# Patient Record
Sex: Male | Born: 1960 | Race: White | Hispanic: No | Smoking: Current every day smoker
Health system: Southern US, Community
[De-identification: ages and names within clinical notes are randomized; demographics above are authoritative.]

## PROBLEM LIST (undated history)

## (undated) DIAGNOSIS — F329 Major depressive disorder, single episode, unspecified: Secondary | ICD-10-CM

## (undated) DIAGNOSIS — F32A Depression, unspecified: Secondary | ICD-10-CM

## (undated) HISTORY — PX: OTHER SURGICAL HISTORY: SHX169

---

## 2002-03-29 ENCOUNTER — Encounter: Payer: Self-pay | Admitting: Orthopedic Surgery

## 2002-04-01 ENCOUNTER — Encounter: Payer: Self-pay | Admitting: Orthopedic Surgery

## 2002-04-01 ENCOUNTER — Inpatient Hospital Stay (HOSPITAL_COMMUNITY): Admission: RE | Admit: 2002-04-01 | Discharge: 2002-04-03 | Payer: Self-pay | Admitting: Orthopedic Surgery

## 2002-06-08 ENCOUNTER — Observation Stay (HOSPITAL_COMMUNITY): Admission: EM | Admit: 2002-06-08 | Discharge: 2002-06-08 | Payer: Self-pay | Admitting: Emergency Medicine

## 2002-06-08 ENCOUNTER — Encounter: Payer: Self-pay | Admitting: Emergency Medicine

## 2002-06-08 ENCOUNTER — Encounter: Payer: Self-pay | Admitting: Orthopedic Surgery

## 2002-07-17 ENCOUNTER — Encounter: Payer: Self-pay | Admitting: Emergency Medicine

## 2002-07-17 ENCOUNTER — Encounter: Payer: Self-pay | Admitting: Specialist

## 2002-07-17 ENCOUNTER — Inpatient Hospital Stay (HOSPITAL_COMMUNITY): Admission: EM | Admit: 2002-07-17 | Discharge: 2002-07-17 | Payer: Self-pay | Admitting: Emergency Medicine

## 2002-08-30 ENCOUNTER — Encounter: Payer: Self-pay | Admitting: Specialist

## 2002-08-30 ENCOUNTER — Encounter: Payer: Self-pay | Admitting: Orthopedic Surgery

## 2002-08-30 ENCOUNTER — Emergency Department (HOSPITAL_COMMUNITY): Admission: EM | Admit: 2002-08-30 | Discharge: 2002-08-30 | Payer: Self-pay | Admitting: *Deleted

## 2002-08-30 ENCOUNTER — Encounter: Payer: Self-pay | Admitting: *Deleted

## 2010-06-29 ENCOUNTER — Emergency Department (HOSPITAL_COMMUNITY): Admission: EM | Admit: 2010-06-29 | Discharge: 2010-06-29 | Payer: Self-pay | Admitting: Emergency Medicine

## 2011-03-17 LAB — POCT I-STAT, CHEM 8
BUN: 9 mg/dL (ref 6–23)
Creatinine, Ser: 1 mg/dL (ref 0.4–1.5)
Glucose, Bld: 83 mg/dL (ref 70–99)
Potassium: 4.3 mEq/L (ref 3.5–5.1)
Sodium: 141 mEq/L (ref 135–145)

## 2011-03-17 LAB — DIFFERENTIAL
Lymphocytes Relative: 13 % (ref 12–46)
Lymphs Abs: 1.6 10*3/uL (ref 0.7–4.0)
Monocytes Relative: 5 % (ref 3–12)
Neutrophils Relative %: 82 % — ABNORMAL HIGH (ref 43–77)

## 2011-03-17 LAB — CBC
Hemoglobin: 14.3 g/dL (ref 13.0–17.0)
MCV: 100.7 fL — ABNORMAL HIGH (ref 78.0–100.0)
Platelets: 235 10*3/uL (ref 150–400)
RBC: 4.01 MIL/uL — ABNORMAL LOW (ref 4.22–5.81)
WBC: 12.2 10*3/uL — ABNORMAL HIGH (ref 4.0–10.5)

## 2011-03-17 LAB — RAPID URINE DRUG SCREEN, HOSP PERFORMED
Amphetamines: NOT DETECTED
Barbiturates: NOT DETECTED
Benzodiazepines: NOT DETECTED
Opiates: NOT DETECTED

## 2011-03-17 LAB — URINALYSIS, ROUTINE W REFLEX MICROSCOPIC
Bilirubin Urine: NEGATIVE
Glucose, UA: NEGATIVE mg/dL
Hgb urine dipstick: NEGATIVE
Ketones, ur: NEGATIVE mg/dL
Leukocytes, UA: NEGATIVE
pH: 5 (ref 5.0–8.0)

## 2011-03-17 LAB — POCT CARDIAC MARKERS
CKMB, poc: 1.8 ng/mL (ref 1.0–8.0)
Myoglobin, poc: 206 ng/mL (ref 12–200)
Troponin i, poc: <0.05 ng/mL (ref 0.00–0.09)

## 2011-05-17 NOTE — H&P (Signed)
[facility]  Patient:    Larry Salinas, Larry Salinas Visit Number: [id number] MRN: [id number]          Service Type: Attending:  [name], M.D. Dictated by:   Sammuel Cooper. Mahar, P.A. Adm. Date:  04/01/02                           History and Physical  DATE OF BIRTH:  May 14, 1961.  CHIEF COMPLAINT:  Right hip pain.  HISTORY OF PRESENT ILLNESS:  The patient is a 50 year old male with a 3 month history of right hip and leg pain near constant in nature.  He has a history of having a total hip replacement in 1995 on the right hip and had been doing well until approximately 3 months ago.  This initial hip replacement was done by Dr. Fraser Din.  Over the last 3 months it has been significantly worse and it has gotten to the point that he is having difficulty ambulating.  It is significantly interfering with his activities of daily living, his work and his quality of life.  Risks and benefits of the proposed procedure were discussed with the patient by Dr. [name] and he indicated understanding and wished to proceed.  ALLERGIES:  No known drug allergies.  MEDICATIONS:  Darvocet-N-100, 2 tablets p.o. b.i.d. to t.i.d. p.r.n. pain.  PAST MEDICAL HISTORY:  Healthy.  PAST SURGICAL HISTORY: 1. Left total hip arthroplasty in 1993. 2. Left total hip acetabular revision in 1998. 3. Right total hip arthroplasty in 1995.  SOCIAL HISTORY:  The patient smokes approximately 1 pack of cigarettes per day.  He drinks alcohol on a rare basis.  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  FAMILY HISTORY:  Father alive at age 4 with a history of a stroke, otherwise healthy. Mother deceased at age 48 of unknown cause.  REVIEW OF SYSTEMS:  The patient denies any fevers, chills, sweats or bleeding tendencies.  CNS:  Denies any blurred vision, double vision, seizures, headache, paralysis.   Cardiovascular:  Denies any chest pain, angina, orthopnea, claudication, palpitations.  Pulmonary:  Denies any shortness of breath, productive cough or hemoptysis.  GI:  Denies nausea, vomiting, constipation, diarrhea, melena or blood stool.  GU:  Denies dysuria, hematuria, discharge.  Musculoskeletal:   Complains of right lower extremity pain but denies any paresthesia, numbness or paralysis.  PHYSICAL EXAMINATION:  VITAL SIGNS:  Blood pressure 104/72, respirations 16, and unlabored.  Pulse is 56 and regular.  GENERAL:  The patient is a 50 year old white male who is alert and oriented in no acute distress.  Well-nourished well-groomed.  Appears his stated age. Very pleasant and cooperative to examination.  Walks with an antalgic gait.  HEENT:  Head is normocephalic and atraumatic.  Pupils, equal, round, reactive to light.  Extraocular movements intact.  Nose:  Patent bilaterally.  Pharynx is clear without any erythema or exudate.  NECK:  Soft and supple.  No bruits appreciated.  No lymphadenopathy or thyromegaly note.  CHEST:  There is wheezes throughout bilateral lung fields.  BREASTS:  Not pertinent and not performed.  HEART:  S1, S2 regular rate and rhythm, no murmurs, rubs or gallops noted.  ABDOMEN:  Soft to palpation.  Nontender nondistended.  No organomegaly noted. Positive bowel sounds throughout.  GU:  Not pertinent and not performed.  EXTREMITIES:  Right hip pain. Positive  posterior tibial pulse equal bilaterally.  Sensation is grossly intact to light touch throughout bilateral lower extremities and distal motor function is intact 5/5.  SKIN:  Intact without any lesions or rashes.  X-RAYS:  Reveal acetabular component being in near vertical position without any screws for fixation.  IMPRESSION:  Loose acetabular component, right total hip.  PLAN:  Admit to [facility] on April 01, 2002, for a right total hip acetabular revision which will be done by  Dr. [name]. Dictated by:   Sammuel Cooper. Mahar, P.A. Attending:  [name], M.D. DD:  03/29/02 TD:  03/29/02 Job: [id number]

## 2011-05-17 NOTE — Op Note (Signed)
[facility]  Patient:    Larry Salinas, Larry Salinas Visit Number: [id number] MRN: [id number]          Service Type: SUR Location: 1800 1826 01 Attending Physician:  Erasmo Leventhal Dictated by:   R. Valma Cava, M.D. Proc. Date: 07/17/02 Admit Date:  07/17/2002 Discharge Date: 07/17/2002                             Operative Report  PREOPERATIVE DIAGNOSIS:  Dislocated right total hip arthroplasty.  POSTOPERATIVE DIAGNOSIS:  Dislocated right total hip arthroplasty.  OPERATION:  Closed reduction of dislocated right total hip arthroplasty with examination under anesthesia utilizing fluoroscopy.  SURGEON:  R. Valma Cava, M.D.  ANESTHESIA:  General.  ANESTHESIOLOGIST:  Janetta Hora. Gelene Mink, M.D.  ESTIMATED BLOOD LOSS:  None.  COMPLICATIONS:  None.  DISPOSITION:  To PACU stable.  DESCRIPTION OF PROCEDURE:  The patient had been counseled in the holding area in the emergency room.  He was taken to the OR and placed in the supine position under general anesthesia.  Following this, he underwent general closed reduction with 90 degree flexion of the hip and the knee with general longitudinal traction, slight internal rotation.  The hip had a palpable and notable reduction.  His leg lengths were symmetric after this.  C-arm confirmed anatomic reduction and dislocation.  No complications.  General exacerbation under anesthesia revealed flexion to 90, external rotation to 30 degrees and internal rotation was to neutral.  Abduction was to 40 degrees. He was stable throughout this range of motion as noted above, confirmed clinically and radiographically with fluoroscopy.  _____ was applied. Pulses remained stable.  He was awakened, intubated and taken from the operating room to PACU in stable condition.  PLAN:  Plan will be stabilization to PACU and discharged home when stable. Dictated by:   R. Valma Cava, M.D. Attending Physician:   Erasmo Leventhal DD:  07/17/02 TD:  07/21/02 Job: [id number]

## 2011-05-17 NOTE — Op Note (Signed)
NAME:  Larry Salinas, Larry Salinas                          ACCOUNT NO.:  [id number]   MEDICAL RECORD NO.:  [id number]                   PATIENT TYPE:  EMS   LOCATION:  MINO                                 FACILITY:  [facility]   PHYSICIAN:  Robert A. Thomasena Edis, M.D.             DATE OF BIRTH:  May 24, 1961   DATE OF PROCEDURE:  08/30/2002  DATE OF DISCHARGE:                                 OPERATIVE REPORT   PREOPERATIVE DIAGNOSES:  Right total hip arthroplasty recurrent dislocation.   POSTOPERATIVE DIAGNOSES:  Right total hip arthroplasty recurrent  dislocation.   OPERATION PERFORMED:  Closed reduction and dislocation of right total hip  arthroplasty.  Examination under anesthesia with fluoroscopy.   SURGEON:  Elana Alm. Thomasena Edis, M.D.   ANESTHESIA:  General.   ESTIMATED BLOOD LOSS:  None.   COMPLICATIONS:  None.   DISPOSITION:  To PACU stable.   INDICATIONS FOR PROCEDURE:  The patient is a 50 year old male status post  right total hip arthroplasty with revision.  Last surgery by Dr. Simonne Come.  He now has recurrent dislocation of the right hip.  I last, approximately a  month ago, saw the patient after dislocation.  I did a closed reduction.  He  did not follow up in the office.  Today the patient was lying in bed, rolled  over and the hip popped out and the hip dislocated spontaneously in the  emergency room.  He was found to have a recurrent dislocation of the right  total hip arthroplasty.  He desires closed reduction and he understood the  risks and benefits and wished to proceed.   DESCRIPTION OF PROCEDURE:  The patient was counseled in the emergency  department in the holding area and taken to the operating room and placed in  supine position with general anesthesia.  Following this, the right lower  extremity was examined.  He was shortened and slightly rotated.  It was very  difficult reduction.  Last month it went in much easier.  This month it  required quite a bit more  longitudinal traction and gentle internal rotation  for a palpable reduction.  C-arm utilized.  Examination under anesthesia  revealed he had tight adductors.  He could flex just 80 degrees.  External  rotate 20 and internal rotate 20 degrees.  This was less range of motion  than he has on the left side.  C-arm was utilized to confirm anatomic  reduction.  There were no complications.  He was placed in knee immobilizer.  Circulation remained intact.    PLAN:  He will be awakened, stabilized in the PACU, discharged to home.  He  will be utilizing crutches, weightbearing as tolerated and knee immobilizer  at all times.  Vicodin and Robaxin was prescribed.  He will use ice on the  hip.  He is to follow up in the office in seven to 10 days.  All questions  encouraged  and answered for patient in detail.                                               Robert A. Thomasena Edis, M.D.    RAC/MEDQ  D:  08/30/2002  T:  08/31/2002  Job:  [id number]

## 2011-05-17 NOTE — Op Note (Signed)
[facility]  Patient:    Larry Salinas, Larry Salinas Visit Number: [id number] MRN: [id number]          Service Type: SUR Location: 4W 0482 01 Attending Physician:  [name] Page Dictated by:   [name], M.D. Proc. Date: 04/01/02 Admit Date:  04/01/2002 Discharge Date: 04/03/2002                             Operative Report  PREOPERATIVE DIAGNOSES:  Loose malpositioned acetabular component, right total hip replacement.  POSTOPERATIVE DIAGNOSES:  Loose malpositioned acetabular component, right total hip replacement.  OPERATION PERFORMED:  Revision of acetabular components and femoral head and neck right total hip replacement.  SURGEON:  [name], M.D.  ASSISTANT:  [name], M.D.  ANESTHESIA:  General.  PATHOLOGY AND JUSTIFICATION FOR PROCEDURE:  The total hip was originally placed by Dr. Wilmer Salinas in 1996 using the dual geometry HA modality for the acetabulum. Sometime around the first of the year, he developed pain and inability to use the right leg. When I saw him on March 04, 2002, the femoral component was in good condition but he had vertical orientation to the acetabulum and it was felt revision was indicated. From a technical standpoint, he had a 56 mm cup, a plus zero 26 mm femoral head.  DESCRIPTION OF PROCEDURE:  Prophylactic antibiotics, satisfactory general anesthesia, Foley catheter inserted, left lateral decubitus position using the [name] frame, the right hip was prepped with duraprep, draped in a sterile field. I went through the old surgical incision through the fascia lata, external rotators were opened adjacent to the femur and the capsule which was quite thickened was opened. A small amount of clear looking fluid came forward, there was no evidence for any infection. I then performed a subtotal capsulectomy dislocating the hip and removing the femoral head. I then tried to expose the acetabular  component which from my position as a surgeon was basically sitting in the position like a bowl on a table. His perimeter was overgrown with soft tissue and we spent a good bit of time removing most of it. We were unable to budge either the cup or the plastic liner using standard technique of cortical screw through the polyethylene into the metal acetabulum. Consequently I had to take out the polyethylene piecemeal with osteotome and rongeur and when most of it had been removed, we were able to get enough leverage that we were able to remove the remainder of the polyethylene in the acetabulum. I then inspected the acetabulum, cleaned it of a good bit of fibrous tissue and began the acetabular reaming process eventually ending up at a 68 mm cup which gave what we felt was good bony contact around the perimeter. We also went as deep as we could checking this with a drill bit. There was still some of the ridging from the dual geometry remaining superiorly. After going through a trial reduction and finding it stable and in doing this checking the position of the cup, I went ahead and placed bony ______ material from the acetabular reamings and the acetabulum. I then impacted tightly the final 68 microstructured cup. I then stabilized it further with two screws, one vertically into the ileum and the other slightly posterior to this. I then went through another trial reduction and found once again the hip to be stable and placed the 10 degree polyethylene liner with a scribe  line at 12 oclock. I then went through one more trial reduction and found that we could dislocate the hip using a plus 5 neck length with the leg in extreme internal rotation and flexion and when we went to a plus 10 neck length, the hip was not dislocatable. Also leg lengths appeared to be symmetrical. Consequently, we went with our final neck length of plus 10 C tapered head and we also converted from a 26 to a 32 mm head  for better stability. I also used the cross fire polyethylene liner for more longevity. After checking the hip for final stability, we irrigated it well with some sterile saline and then closed the external rotators against the femur and the fascia lata with interrupted #1 Vicryl in layers. The subcutaneous tissue was closed with a combination of #0 and 2-0 Vicryl, skin with staples. Betadine and Adaptic dry sterile dressing were applied. He was gently placed on his back and in an abduction pillow and taken to the recovery room in satisfactory condition with no known complications. Estimated blood loss was perhaps 300 cc with no blood replacement. Dictated by:   [name], M.D. Attending Physician:  [name] DD:  04/01/02 TD:  04/02/02 Job: [id number]

## 2011-05-17 NOTE — Op Note (Signed)
[facility]  Patient:    Larry Salinas, Larry Salinas Visit Number: [id number] MRN: [id number]          Service Type: OBV Location: 1800 1831 01 Attending Physician:  Skip Mayer Dictated by:   Georges Lynch Darrelyn Hillock, M.D. Proc. Date: 06/08/02 Admit Date:  06/08/2002 Discharge Date: 06/08/2002                             Operative Report  PREOPERATIVE DIAGNOSIS:  Posterior dislocation, right total hip.  POSTOPERATIVE DIAGNOSIS:  Posterior dislocation right total hip.  HISTORY:  He presented to the emergency room today, my day on-call on June 08, 2002, with a posterior dislocation of his right total hip. This total hip was performed by Dr. Simonne Come several months ago. The patient called me just for a little background. On Saturday he called me and he said he was sitting up in a chair and he was not sure if his hip was out and he asked me if I thought one of his relatives could push his buttocks and if it was out push it in. I told him I would he get down flat in bed, look at his feet, make sure his hip is out of place before he calls the ambulance and if it is or if his foot was shortened and turned in as I told him then call and ambulance and he should go to [facility] Emergency Room and I told him I would meet him there. Well that was the last I heard from him. Then I understand, he went in Sunday to the emergency room when one of my partners was on and he said and I quote "I got tired of waiting and they took me out of turn, so I just when home", so I am not sure when his hip was dislocated. We do not have anything documented on that. Anyway, he presented with a definite posterolateral dislocation of his right total hip. He does have a left total hip in as well and it is stable.  OPERATION PERFORMED:  Closed manipulation under general anesthesia of a right total hip dislocation.  DESCRIPTION OF PROCEDURE:  Under general anesthesia by use of the  C-arm, I was able to flex and internally rotate his right hip and I had another individual pushing on the posterior aspect of the prosthesis and we reduced his hip and the hip was stable post reduction. I was able to take his hip through motion without any difficulty. We reduced him quite a bit in size and this I had expected to have. X-ray in the emergency room, C-arm, showed he was anatomically reduced. I immediately placed him in the hip spica brace that I ordered for him. Incidentally, he did have a good dorsalis pedis pulse postop. He was still asleep in regards to me trying to get him to dorsiflex and plantarflex his foot. Dictated by:   Georges Lynch Darrelyn Hillock, M.D. Attending Physician:  Skip Mayer DD:  06/08/02 TD:  06/10/02 Job: [id number]

## 2011-05-17 NOTE — Consult Note (Signed)
NAME:  Larry Salinas, Larry Salinas                          ACCOUNT NO.:  [id number]   MEDICAL RECORD NO.:  [id number]                   PATIENT TYPE:  INP   LOCATION:  1824                                 FACILITY:  [facility]   PHYSICIAN:  Robert A. Thomasena Edis, M.D.             DATE OF BIRTH:  15-Jun-1961   DATE OF CONSULTATION:  08/30/2002  DATE OF DISCHARGE:                                   CONSULTATION   HISTORY OF PRESENT ILLNESS:  This is a 50 year old male, status post right  total hip arthroplasty by [name], M.D.  He has had two  revisions.  He has had recurrent dislocations.  Approximately one month ago,  he had a dislocated right total hip arthroplasty.  I evaluated the patient  in the ER and we went to the OR for a closed reduction.  He has gone home.  He has done well, but he has not followed up with Fayrene Fearing P. Aplington, M.D.,  as has been requested.  Today he was laying in bed, rolled over, and  redislocated his hip this morning.  He came to the [facility] Emergency Room complaining of right hip pain.  He has no history of  trauma.   PHYSICAL EXAMINATION:  GENERAL APPEARANCE:  He is awake and alert.  He  answers questions appropriately.  EXTREMITIES:  He has his foot basically with a rubber band around it,  holding it in a neutral position.  He is shortened and somewhat externally  rotated.  He has good pulses distally.  He can dorsiflex and plantar flex  his foot well.  He has good sensation to light touch.   LABORATORY DATA:  Plain x-rays reveal a dislocated right total hip  arthroplasty without fracture or complication noted.   IMPRESSION:  Status post right total hip arthroplasty with recurrent  instability, now status post recurrent dislocation.   RECOMMENDATIONS:  I had a lengthy discussion with the patient.  I would like  to reduce this for him in the OR and send him home.  Then it is mandatory  that he follow up with [name], M.D.,  for further care.  He may  need to have this revised to a constrained cup and/or other concepts for hip  reconstructive surgery.  Nevertheless, he is fully aware that he needs to  see Dr. Simonne Come and if he does not see Dr. Simonne Come, that may cause  problems in the future with follow-up.   PLAN:  He will be taken to the OR for general anesthetic, closed reduction,  and examination under anesthesia with fluoroscopy.  He will be recuperated  postoperatively in the PACU and discharged to home in a knee immobilizer.  I  would like him to wear that at all times, except when showering.  Then  follow up with [name], M.D., in about a week to 10 days.  He  will be discharged to home on Vicodin for pain and Robaxin for spasm.  He is  to use an ice pack.                                               Robert A. Thomasena Edis, M.D.    RAC/MEDQ  D:  08/30/2002  T:  08/31/2002  Job:  [id number]

## 2011-05-17 NOTE — Consult Note (Signed)
. [facility]  Patient:    Larry Salinas, Larry Salinas Visit Number: [id number] MRN: [id number]          Service Type: SUR Location: 1800 1826 01 Attending Physician:  Erasmo Leventhal Dictated by:   R. Valma Cava, M.D. Admit Date:  07/17/2002 Discharge Date: 07/17/2002   CC:         Fayrene Fearing P. Aplington, M.D.   Consultation Report  HISTORY OF PRESENT ILLNESS:  The patient is a 50 year old male well known to our practice and Dr. Simonne Come.  In summary, he has a history of bilateral avascular necrosis of the hips.  He had a right total hip replacement done in the past.  Three months ago, he underwent a revision of the right total hip arthroplasty.  He has a history of ethanol abuse.  Approximately one month ago, he dislocated the total hip replacement while drinking alcohol.  He underwent a closed reduction by Dr. Darrelyn Hillock.  Last night, he states he was celebrating his birthday.  He was drinking alcohol.  After midnight, he was lying on the floor, rolled over, and felt his hip dislocate.  For some reason, he decided not to come to the emergency room until today around noontime.  At that time, he was complaining of right hip pain.  He did not have a fall.  He had no other injuries.  ALLERGIES/MEDICATIONS/PREVIOUS MEDICAL HISTORY:  Previously well documented in the chart.  PHYSICAL EXAMINATION:  GENERAL:  He is awake and alert and answers questions appropriately. Accompanied by some family members.  His ______ is somewhat shortened.  His hip is tender to touch.  There is pain on passive range of motion.  Thigh, knee, leg, foot, and ankle negative on the right side.  He has 4+ dorsalis and posterior tibial pulses.  He can dorsiflex and plantiflex the foot and ankle well.  He has good sensation to light touch of the dorsal and plantar aspect of the foot.  LABORATORY DATA:  Plain x-rays reviewed.  He has a superior dislocation in his right total hip  arthroplasty.  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  RECOMMENDATIONS:  Recommendation will be closed reduction.  I went through risks and benefits with him in detail including possibility of a fracture, implant damage, recurrent instability, anesthetic risk which ______ anesthesiologist, etc. and he wished to proceed.  He is well familiar with this.  He has a brace at home.  He will undergo closed reduction in the operating room with examination under anesthesia.  If this is successful, he will be stabilized in the PACU and discharged home when awake and alert.  He will be on ______ until he gets home, then go to his brace at that point in time.  Prescriptions will be given for Percocet and Robaxin.  He will follow up in the office with Dr. Darrelyn Hillock in 1-2 weeks.  We encouraged the patient to refrain from drinking alcohol due to the instability noted in his hips. Hopefully, he will heed my recommendations. Dictated by:   R. Valma Cava, M.D. Attending Physician:  Erasmo Leventhal DD:  07/17/02 TD:  07/21/02 Job: [id number]

## 2013-01-05 ENCOUNTER — Emergency Department (HOSPITAL_BASED_OUTPATIENT_CLINIC_OR_DEPARTMENT_OTHER): Payer: Self-pay

## 2013-01-05 ENCOUNTER — Encounter (HOSPITAL_BASED_OUTPATIENT_CLINIC_OR_DEPARTMENT_OTHER): Payer: Self-pay

## 2013-01-05 ENCOUNTER — Emergency Department (HOSPITAL_BASED_OUTPATIENT_CLINIC_OR_DEPARTMENT_OTHER)
Admission: EM | Admit: 2013-01-05 | Discharge: 2013-01-05 | Disposition: A | Discharge status: Home / Self Care | Payer: Self-pay | Attending: Emergency Medicine | Admitting: Emergency Medicine

## 2013-01-05 DIAGNOSIS — S9031XA Contusion of right foot, initial encounter: Secondary | ICD-10-CM

## 2013-01-05 DIAGNOSIS — R609 Edema, unspecified: Secondary | ICD-10-CM | POA: Insufficient documentation

## 2013-01-05 DIAGNOSIS — Y929 Unspecified place or not applicable: Secondary | ICD-10-CM | POA: Insufficient documentation

## 2013-01-05 DIAGNOSIS — W2209XA Striking against other stationary object, initial encounter: Secondary | ICD-10-CM | POA: Insufficient documentation

## 2013-01-05 DIAGNOSIS — Y9389 Activity, other specified: Secondary | ICD-10-CM | POA: Insufficient documentation

## 2013-01-05 DIAGNOSIS — S9030XA Contusion of unspecified foot, initial encounter: Secondary | ICD-10-CM | POA: Insufficient documentation

## 2013-01-05 DIAGNOSIS — F17200 Nicotine dependence, unspecified, uncomplicated: Secondary | ICD-10-CM | POA: Insufficient documentation

## 2013-01-05 MED ORDER — IBUPROFEN 800 MG PO TABS: 800.0000 mg | ORAL_TABLET | Freq: Three times a day (TID) | ORAL | Status: DC

## 2013-01-05 MED ORDER — HYDROCODONE-ACETAMINOPHEN 5-325 MG PO TABS
2.0000 | ORAL_TABLET | Freq: Once | ORAL | Status: AC
  Administered 2013-01-05: 2 via ORAL
  Filled 2013-01-05: qty 2

## 2013-01-05 NOTE — ED Notes (Signed)
Pt medicated for comfort.

## 2013-01-05 NOTE — ED Provider Notes (Signed)
History     CSN: [id number]  Arrival date & time 01/05/13  1140   First MD Initiated Contact with Patient 01/05/13 1212      Chief Complaint  Patient presents with  . Foot Injury    (Consider location/radiation/quality/duration/timing/severity/associated sxs/prior treatment) Patient is a 52 y.o. male presenting with foot injury. The history is provided by the patient. No language interpreter was used.  Foot Injury  The incident occurred yesterday. The incident occurred at home. There was no injury mechanism. The pain is present in the right foot. The quality of the pain is described as aching. The pain is moderate. The pain has been constant since onset. Associated symptoms include loss of motion. He reports no foreign bodies present.  Pt reports he hit toe on step.   Pt complains of swelling and pain  History reviewed. No pertinent past medical history.  History reviewed. No pertinent past surgical history.  No family history on file.  History  Substance Use Topics  . Smoking status: Current Every Day Smoker -- 1.0 packs/day    Types: Cigarettes  . Smokeless tobacco: Never Used  . Alcohol Use: Yes     Comment: weekly      Review of Systems  Musculoskeletal: Positive for myalgias.  Skin: Positive for color change.  All other systems reviewed and are negative.    Allergies  Review of patient's allergies indicates no known allergies.  Home Medications  No current outpatient prescriptions on file.  BP 156/109  Pulse 117  Temp 98.8 F (37.1 C) (Oral)  Resp 16  Ht 5\' 10"  (1.778 m)  Wt 150 lb (68.04 kg)  BMI 21.52 kg/m2  SpO2 96%  Physical Exam  Nursing note and vitals reviewed. Constitutional: He appears well-developed and well-nourished.  HENT:  Head: Normocephalic.  Cardiovascular: Normal rate.   Pulmonary/Chest: Effort normal.  Musculoskeletal: He exhibits tenderness.       Bruised swollen 1st toe and 1st metatarsal area,  nv and ns intact    Neurological: He is alert.  Psychiatric: He has a normal mood and affect.    ED Course  Procedures (including critical care time)  Labs Reviewed - No data to display No results found.   1. Contusion of left foot       MDM  Pt given 2 hydrocodone.   Xray no fracture.   Pt placed in ace and post op shoe.   Rx for ibuprofen       Lonia Skinner [place], [place] 01/05/13 1251  Lonia Skinner Schulter, [place] 01/05/13 1254

## 2013-01-05 NOTE — ED Notes (Signed)
Pt reports injury to right foot that occurred last night.  Bruising and pain noted to affected foot.

## 2013-01-06 NOTE — ED Provider Notes (Signed)
Medical screening examination/treatment/procedure(s) were performed by non-physician practitioner and as supervising physician I was immediately available for consultation/collaboration.   Jackolyn Geron B. Bernette Mayers, MD 01/06/13 1308

## 2013-05-20 IMAGING — CR DG FOOT COMPLETE 3+V*R*
3 series · 3 of 3 positions shown · non-contrast
Comparison: None.

CLINICAL DATA: Fall.  Foot pain and bruising.

RIGHT FOOT COMPLETE - 3+ VIEW

[t foot ap right]
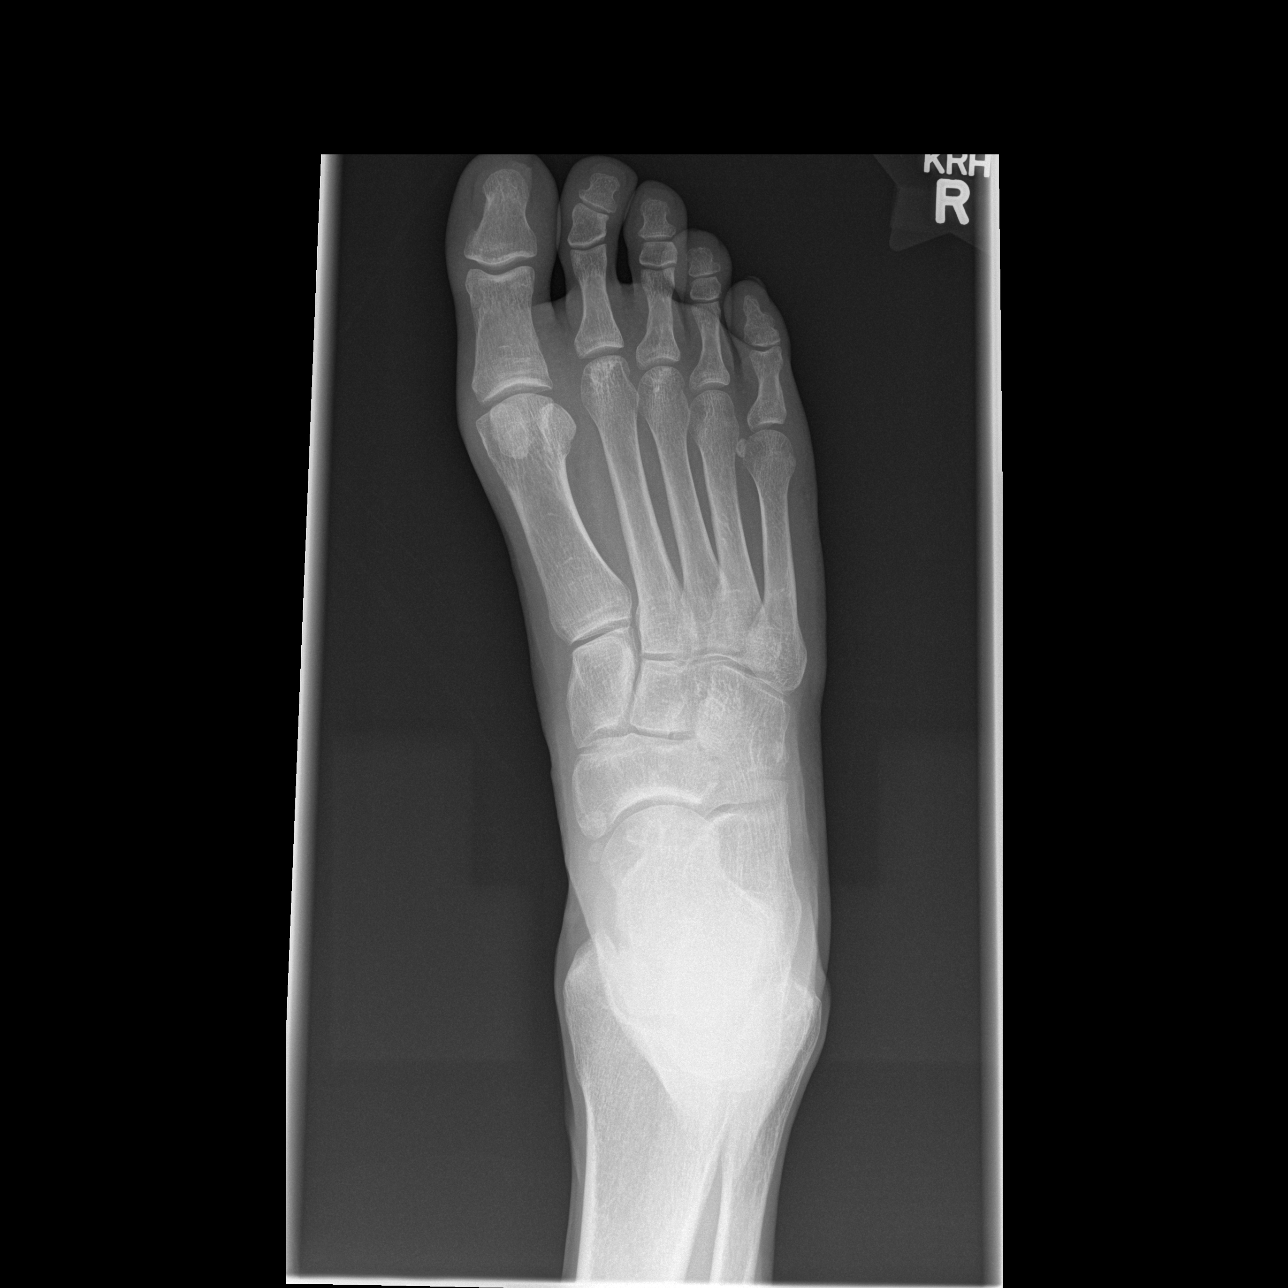

[t foot oblique right]
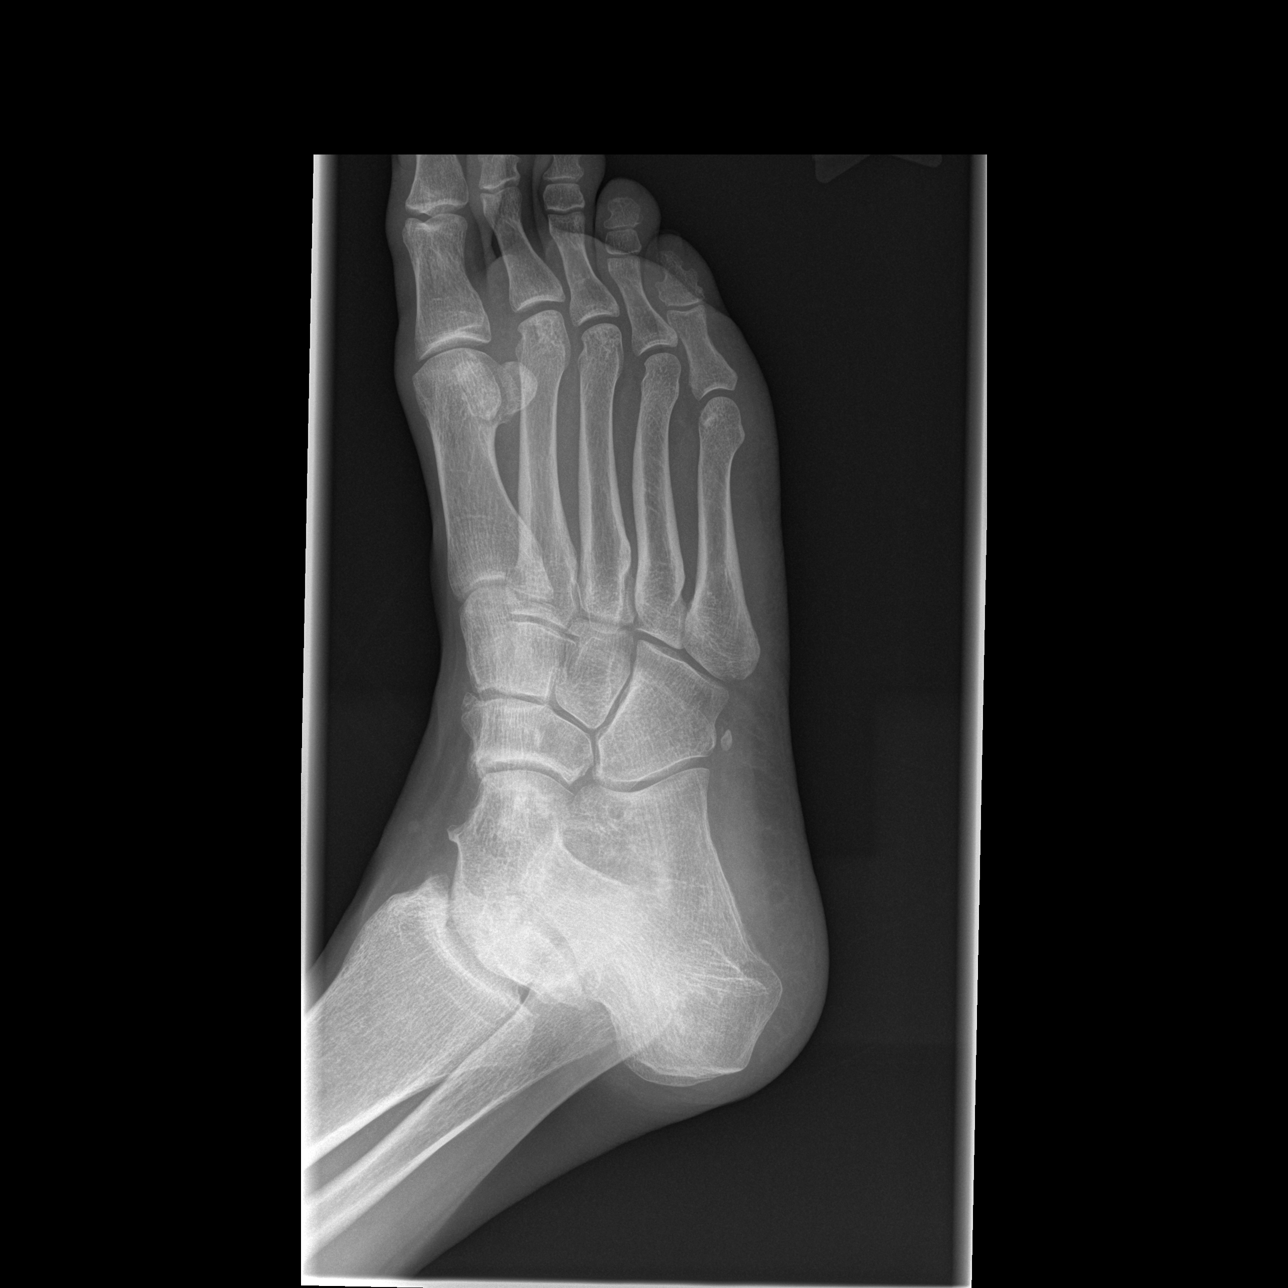

[t foot lat right]
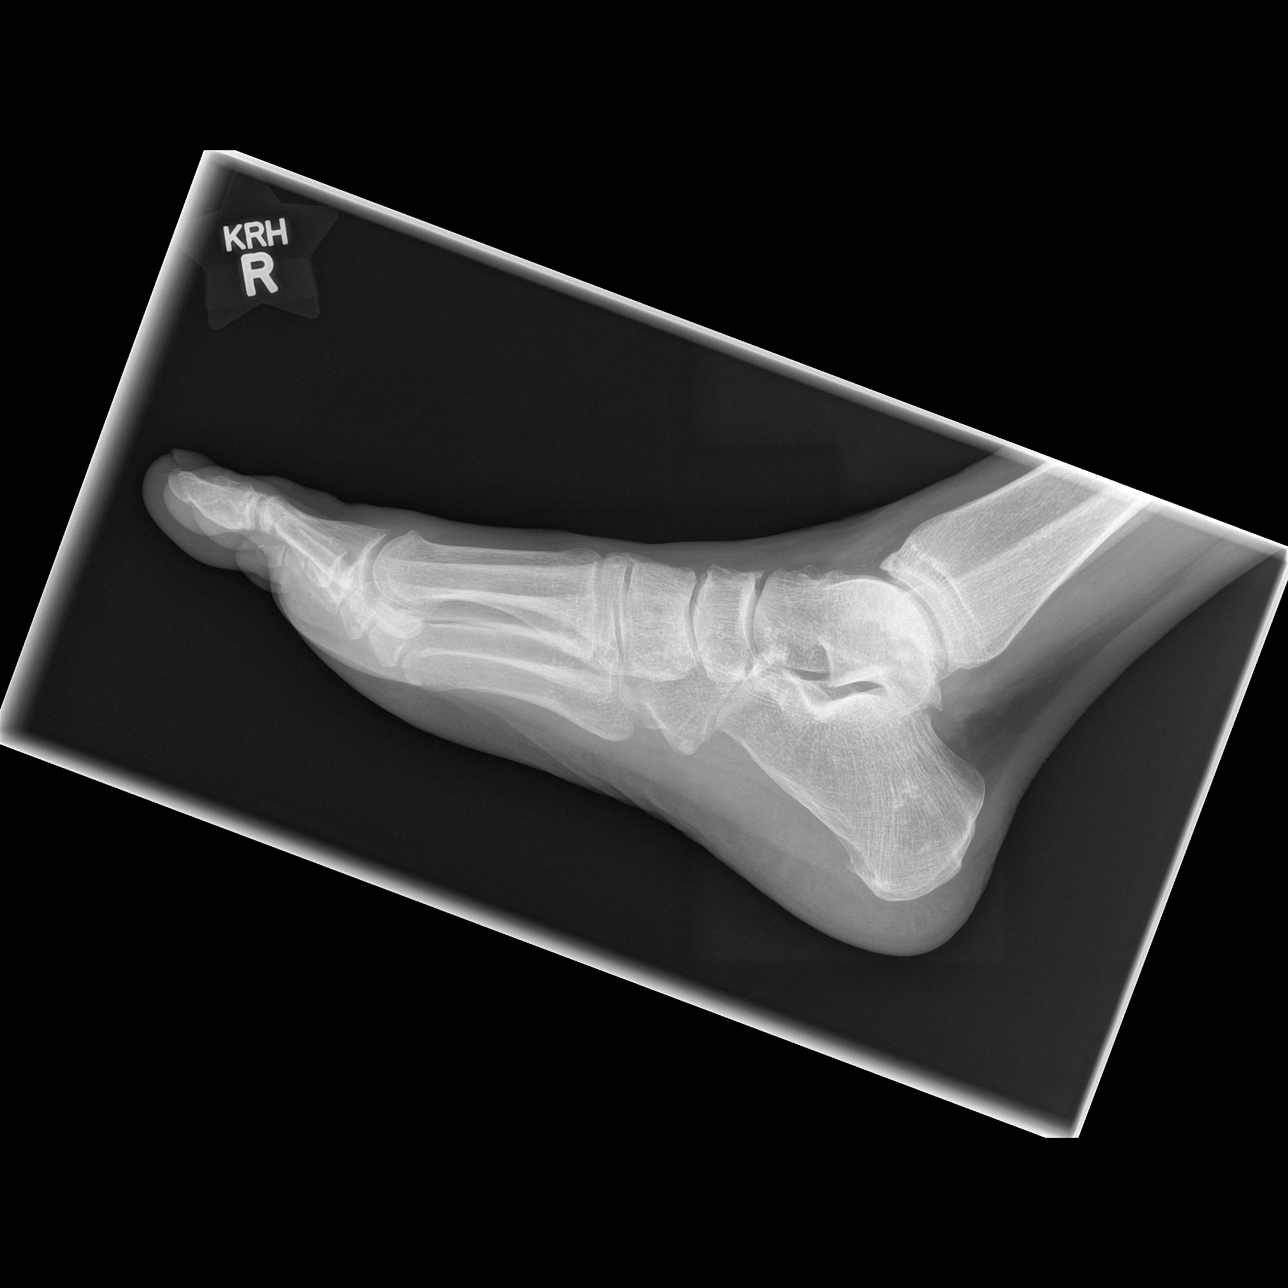

[3 of 3 positions shown; findings below may reference images not displayed]

FINDINGS: There is no evidence of fracture or dislocation.  There
is no evidence of arthropathy or other focal bone abnormality.
Soft tissues are unremarkable.
IMPRESSION: Negative.

## 2014-03-02 ENCOUNTER — Encounter (HOSPITAL_COMMUNITY): Payer: Self-pay | Admitting: Emergency Medicine

## 2014-03-02 ENCOUNTER — Emergency Department (HOSPITAL_COMMUNITY)
Admission: EM | Admit: 2014-03-02 | Discharge: 2014-03-03 | Disposition: A | Discharge status: Other Acute Inpatient Hospital | Payer: Federal, State, Local not specified - Other | Attending: Emergency Medicine | Admitting: Emergency Medicine

## 2014-03-02 DIAGNOSIS — G479 Sleep disorder, unspecified: Secondary | ICD-10-CM | POA: Insufficient documentation

## 2014-03-02 DIAGNOSIS — F329 Major depressive disorder, single episode, unspecified: Secondary | ICD-10-CM | POA: Diagnosis present

## 2014-03-02 DIAGNOSIS — F10929 Alcohol use, unspecified with intoxication, unspecified: Secondary | ICD-10-CM

## 2014-03-02 DIAGNOSIS — F17200 Nicotine dependence, unspecified, uncomplicated: Secondary | ICD-10-CM | POA: Insufficient documentation

## 2014-03-02 DIAGNOSIS — F1010 Alcohol abuse, uncomplicated: Secondary | ICD-10-CM | POA: Insufficient documentation

## 2014-03-02 DIAGNOSIS — R45851 Suicidal ideations: Secondary | ICD-10-CM

## 2014-03-02 DIAGNOSIS — F419 Anxiety disorder, unspecified: Secondary | ICD-10-CM | POA: Diagnosis present

## 2014-03-02 DIAGNOSIS — F3289 Other specified depressive episodes: Secondary | ICD-10-CM | POA: Insufficient documentation

## 2014-03-02 DIAGNOSIS — F32A Depression, unspecified: Secondary | ICD-10-CM

## 2014-03-02 HISTORY — DX: Depression, unspecified: F32.A

## 2014-03-02 HISTORY — DX: Major depressive disorder, single episode, unspecified: F32.9

## 2014-03-02 LAB — SALICYLATE LEVEL

## 2014-03-02 LAB — COMPREHENSIVE METABOLIC PANEL
ALBUMIN: 4.3 g/dL (ref 3.5–5.2)
ALK PHOS: 121 U/L — AB (ref 39–117)
ALT: 23 U/L (ref 0–53)
AST: 23 U/L (ref 0–37)
BILIRUBIN TOTAL: 0.3 mg/dL (ref 0.3–1.2)
BUN: 7 mg/dL (ref 6–23)
CHLORIDE: 96 meq/L (ref 96–112)
CO2: 21 mEq/L (ref 19–32)
CREATININE: 0.82 mg/dL (ref 0.50–1.35)
Calcium: 9.6 mg/dL (ref 8.4–10.5)
GFR calc Af Amer: >90 mL/min (ref >90)
GFR calc non Af Amer: >90 mL/min (ref >90)
Glucose, Bld: 105 mg/dL — ABNORMAL HIGH (ref 70–99)
POTASSIUM: 3.9 meq/L (ref 3.7–5.3)
SODIUM: 136 meq/L — AB (ref 137–147)
Total Protein: 8.3 g/dL (ref 6.0–8.3)

## 2014-03-02 LAB — RAPID URINE DRUG SCREEN, HOSP PERFORMED
AMPHETAMINES: NOT DETECTED
BENZODIAZEPINES: NOT DETECTED
Barbiturates: NOT DETECTED
Cocaine: NOT DETECTED
Opiates: NOT DETECTED
TETRAHYDROCANNABINOL: POSITIVE — AB

## 2014-03-02 LAB — CBC
HEMATOCRIT: 46.9 % (ref 39.0–52.0)
HEMOGLOBIN: 16.5 g/dL (ref 13.0–17.0)
MCH: 35 pg — AB (ref 26.0–34.0)
MCHC: 35.2 g/dL (ref 30.0–36.0)
MCV: 99.4 fL (ref 78.0–100.0)
PLATELETS: 224 10*3/uL (ref 150–400)
RBC: 4.72 MIL/uL (ref 4.22–5.81)
RDW: 13.7 % (ref 11.5–15.5)
WBC: 9.8 10*3/uL (ref 4.0–10.5)

## 2014-03-02 LAB — ACETAMINOPHEN LEVEL: Acetaminophen (Tylenol), Serum: <15 ug/mL (ref 10–30)

## 2014-03-02 LAB — ETHANOL: Alcohol, Ethyl (B): 303 mg/dL — ABNORMAL HIGH (ref 0–11)

## 2014-03-02 MED ORDER — NICOTINE 21 MG/24HR TD PT24
21.0000 mg | MEDICATED_PATCH | Freq: Every day | TRANSDERMAL | Status: DC
  Administered 2014-03-03: 21 mg via TRANSDERMAL
  Filled 2014-03-02: qty 1

## 2014-03-02 MED ORDER — ACETAMINOPHEN 325 MG PO TABS: 650.0000 mg | ORAL_TABLET | ORAL | Status: DC | PRN

## 2014-03-02 MED ORDER — ONDANSETRON HCL 4 MG PO TABS: 4.0000 mg | ORAL_TABLET | Freq: Three times a day (TID) | ORAL | Status: DC | PRN

## 2014-03-02 MED ORDER — ZOLPIDEM TARTRATE 5 MG PO TABS
5.0000 mg | ORAL_TABLET | Freq: Every evening | ORAL | Status: DC | PRN
  Administered 2014-03-03: 5 mg via ORAL
  Filled 2014-03-02: qty 1

## 2014-03-02 MED ORDER — ALUM & MAG HYDROXIDE-SIMETH 200-200-20 MG/5ML PO SUSP: 30.0000 mL | ORAL | Status: DC | PRN

## 2014-03-02 MED ORDER — IBUPROFEN 200 MG PO TABS
600.0000 mg | ORAL_TABLET | Freq: Three times a day (TID) | ORAL | Status: DC | PRN
  Administered 2014-03-02: 600 mg via ORAL
  Filled 2014-03-02: qty 3

## 2014-03-02 MED ORDER — LORAZEPAM 1 MG PO TABS
1.0000 mg | ORAL_TABLET | Freq: Three times a day (TID) | ORAL | Status: DC | PRN
  Administered 2014-03-02: 1 mg via ORAL
  Filled 2014-03-02: qty 1

## 2014-03-02 NOTE — ED Notes (Signed)
Verbal report given to psych ED by Donnita FallsFaith-Marie, RN.  Pt tx to [facility]37 at this time.  Wanded by security and belonging secured by med tech.  Nad upon leaving main ED.

## 2014-03-02 NOTE — Progress Notes (Signed)
   CARE MANAGEMENT ED NOTE 03/02/2014  Patient:  Larry Salinas,Larry Salinas   Account Number:  [id number]  Date Initiated:  03/02/2014  Documentation initiated by:  Radford PaxFERRERO,Jazmarie Biever  Subjective/Objective Assessment:   Patient presents to Ed with depression and SI.     Subjective/Objective Assessment Detail:   Patient with past medical history of depression.     Action/Plan:   Action/Plan Detail:   Anticipated DC Date:       Status Recommendation to Physician:   Result of Recommendation:    Other ED Services  Consult Working Plan    DC Planning Services  Other  PCP issues    Choice offered to / List presented to:            Status of service:  Completed, signed off  ED Comments:   ED Comments Detail:  Patient listed as not haveing a pcp or insurance.  [facility] provided patient with a list of pcps who accept self pay patients, list of financial resources int he community such as local churches and [employer], urban ministries, information regarding Medicaid for insurance, phone number ot call to inquire about the orange card, discounted pharmacies and website needymeds.org for medication assistance, [facility], and dental assistance for patients without insurance.  EDCM placed resources on patient's bedside table as patient was sleeping.  no further EDCM needs at this time.

## 2014-03-02 NOTE — ED Provider Notes (Signed)
CSN: [id number]     Arrival date & time 03/02/14  1610 History   First MD Initiated Contact with Patient 03/02/14 1704     Chief Complaint  Patient presents with  . Medical Clearance     (Consider location/radiation/quality/duration/timing/severity/associated sxs/prior Treatment) HPI Pt is a 53yo male with hx of depression brought to ED by his brother b/c pt has been having thoughts of hurting himself and asked his brother bring him here for help. Pt admits to drinking two 40oz of alcohol earlier today.  Pt is a poor historian as he appears clinically intoxicated. States he wants to hurt himself the fastest way possible [motioned with his hand he would shoot himself in the head].  Pt admits to drinking and smoking "weed" today, and taking his brother's trazodone 3 days ago.  Denies any other drug use including cocaine or heroine. State he has never seen a counselor, "that's why i'm here."  Denies known hx of bipolar disorder or schizophrenia. Denies auditory or visual hallucinations.  Denies HI.  Medical hx, pt reports hip replacement but denies any other significant PMH.  Denies recent falls or hitting his head. Denies pain at this time.  Past Medical History  Diagnosis Date  . Depression    Past Surgical History  Procedure Laterality Date  . Hip replacements     History reviewed. No pertinent family history. History  Substance Use Topics  . Smoking status: Current Every Day Smoker -- 1.00 packs/day    Types: Cigarettes  . Smokeless tobacco: Never Used  . Alcohol Use: Yes     Comment: weekly    Review of Systems  Constitutional: Negative for fever and chills.  Cardiovascular: Negative for chest pain.  Gastrointestinal: Negative for nausea, vomiting and abdominal pain.  Psychiatric/Behavioral: Positive for suicidal ideas and sleep disturbance. Negative for hallucinations and self-injury.  All other systems reviewed and are negative.      Allergies  Review of patient's  allergies indicates no known allergies.  Home Medications   Current Outpatient Rx  Name  Route  Sig  Dispense  Refill  . Aspirin-Salicylamide-Caffeine (BC HEADACHE POWDER PO)   Oral   Take by mouth. Use as directed. "Arthritis strength."          BP 122/77  Pulse 62  Temp(Src) 98.1 F (36.7 C) (Oral)  Resp 20  SpO2 97% Physical Exam  Nursing note and vitals reviewed. Constitutional: He is oriented to person, place, and time. He appears well-developed and well-nourished.  HENT:  Head: Normocephalic and atraumatic.  Eyes: EOM are normal.  Neck: Normal range of motion.  Cardiovascular: Normal rate.   Pulmonary/Chest: Effort normal. No respiratory distress.  Abdominal: Soft. There is no tenderness.  Musculoskeletal: Normal range of motion.  Neurological: He is alert and oriented to person, place, and time.  Pt appears clinically intoxicated. Able to answer some questions and knows he is at the hospital but has to be redirected multiple times to stay on topic.  Skin: Skin is warm and dry.  Psychiatric: His speech is slurred. Hallucinating:  denies hearing voices but is talking out loud to no one specific  Thought content is not delusional. He expresses suicidal ideation. He expresses no homicidal ideation. He expresses suicidal plans. He expresses no homicidal plans.    ED Course  Procedures (including critical care time) Labs Review Labs Reviewed  CBC - Abnormal; Notable for the following:    MCH 35.0 (*)    All other components within normal limits  COMPREHENSIVE METABOLIC PANEL - Abnormal; Notable for the following:    Sodium 136 (*)    Glucose, Bld 105 (*)    Alkaline Phosphatase 121 (*)    All other components within normal limits  ETHANOL - Abnormal; Notable for the following:    Alcohol, Ethyl (B) 303 (*)    All other components within normal limits  SALICYLATE LEVEL - Abnormal; Notable for the following:    Salicylate Lvl <2.0 (*)    All other components within  normal limits  URINE RAPID DRUG SCREEN (HOSP PERFORMED) - Abnormal; Notable for the following:    Tetrahydrocannabinol POSITIVE (*)    All other components within normal limits  ACETAMINOPHEN LEVEL   Imaging Review No results found.   EKG Interpretation None      MDM   Final diagnoses:  None    pt brought to ED by brother due to thoughts of hurting himself, pt eluded to shooting himself in the head. Denies HI.  Pt is clinically intoxicated, makes taking a detailed hx difficult.  Denies hallucinations but was talking out loud to no one in particular. Pt is medically cleared. Psych hold orders placed. Disposition to be determined by TTS.    Junius Finnerrin O'Malley, PA-C 03/03/14 1241

## 2014-03-02 NOTE — ED Notes (Signed)
Pt belongings placed in locker #37. Pt belongings were itemized on pt belongings sheet located in shadow chart.

## 2014-03-02 NOTE — ED Notes (Signed)
Per pt, very depressed.  Pt came here by his brother.  Pt asked him to bring him here.  Pt having thoughts of hurting self.  Pt has hx of thoughts with no attempts.

## 2014-03-02 NOTE — ED Notes (Signed)
Patient c/o feeling depressed. States he needs help. Patient is anxious, slurred speech, with paranoia. States having hip replacement and not working has increased his depression. C/o of having trouble relaxing and getting to sleep at present.   Encouragement offered. Given Motrin and Ativan.  Patient safety maintained, Q 15 checks continue.

## 2014-03-03 ENCOUNTER — Encounter (HOSPITAL_COMMUNITY): Payer: Self-pay | Admitting: Registered Nurse

## 2014-03-03 ENCOUNTER — Inpatient Hospital Stay (HOSPITAL_COMMUNITY)
Admission: AD | Admit: 2014-03-03 | Discharge: 2014-03-07 | DRG: 885 | Disposition: A | Discharge status: Home / Self Care | Payer: Federal, State, Local not specified - Other | Source: Intra-hospital | Attending: Psychiatry | Admitting: Psychiatry

## 2014-03-03 ENCOUNTER — Ambulatory Visit: Admit: 2014-03-03 | Payer: Self-pay | Admitting: Psychiatry

## 2014-03-03 ENCOUNTER — Encounter (HOSPITAL_COMMUNITY): Payer: Self-pay | Admitting: Behavioral Health

## 2014-03-03 DIAGNOSIS — Q430 Meckel's diverticulum (displaced) (hypertrophic): Secondary | ICD-10-CM | POA: Insufficient documentation

## 2014-03-03 DIAGNOSIS — F1010 Alcohol abuse, uncomplicated: Secondary | ICD-10-CM | POA: Diagnosis present

## 2014-03-03 DIAGNOSIS — F411 Generalized anxiety disorder: Secondary | ICD-10-CM | POA: Diagnosis present

## 2014-03-03 DIAGNOSIS — Z96649 Presence of unspecified artificial hip joint: Secondary | ICD-10-CM

## 2014-03-03 DIAGNOSIS — R45851 Suicidal ideations: Secondary | ICD-10-CM

## 2014-03-03 DIAGNOSIS — F419 Anxiety disorder, unspecified: Secondary | ICD-10-CM | POA: Diagnosis present

## 2014-03-03 DIAGNOSIS — G4700 Insomnia, unspecified: Secondary | ICD-10-CM | POA: Diagnosis present

## 2014-03-03 DIAGNOSIS — F10129 Alcohol abuse with intoxication, unspecified: Secondary | ICD-10-CM

## 2014-03-03 DIAGNOSIS — F17200 Nicotine dependence, unspecified, uncomplicated: Secondary | ICD-10-CM | POA: Diagnosis present

## 2014-03-03 DIAGNOSIS — F329 Major depressive disorder, single episode, unspecified: Secondary | ICD-10-CM

## 2014-03-03 DIAGNOSIS — F332 Major depressive disorder, recurrent severe without psychotic features: Principal | ICD-10-CM | POA: Diagnosis present

## 2014-03-03 DIAGNOSIS — F1994 Other psychoactive substance use, unspecified with psychoactive substance-induced mood disorder: Secondary | ICD-10-CM | POA: Diagnosis present

## 2014-03-03 MED ORDER — NICOTINE 21 MG/24HR TD PT24
21.0000 mg | MEDICATED_PATCH | Freq: Every day | TRANSDERMAL | Status: DC
  Administered 2014-03-04 – 2014-03-07 (×4): 21 mg via TRANSDERMAL
  Filled 2014-03-03 (×8): qty 1

## 2014-03-03 MED ORDER — SALINE SPRAY 0.65 % NA SOLN
1.0000 | NASAL | Status: DC | PRN
  Filled 2014-03-03: qty 44

## 2014-03-03 MED ORDER — IBUPROFEN 600 MG PO TABS
600.0000 mg | ORAL_TABLET | Freq: Four times a day (QID) | ORAL | Status: DC | PRN
  Administered 2014-03-03 – 2014-03-07 (×9): 600 mg via ORAL
  Filled 2014-03-03 (×9): qty 1

## 2014-03-03 MED ORDER — MAGNESIUM HYDROXIDE 400 MG/5ML PO SUSP: 30.0000 mL | Freq: Every day | ORAL | Status: DC | PRN

## 2014-03-03 MED ORDER — TRAZODONE HCL 50 MG PO TABS
50.0000 mg | ORAL_TABLET | Freq: Every evening | ORAL | Status: DC | PRN
  Administered 2014-03-03 – 2014-03-04 (×2): 50 mg via ORAL
  Filled 2014-03-03 (×2): qty 1

## 2014-03-03 MED ORDER — LORAZEPAM 1 MG PO TABS
1.0000 mg | ORAL_TABLET | Freq: Three times a day (TID) | ORAL | Status: DC | PRN
  Administered 2014-03-03 – 2014-03-04 (×2): 1 mg via ORAL
  Filled 2014-03-03: qty 2
  Filled 2014-03-03: qty 1

## 2014-03-03 NOTE — ED Notes (Signed)
[facility]
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
## 2014-03-03 NOTE — Progress Notes (Signed)
Adult Psychoeducational Group Note  Date:  03/03/2014 Time:8:15 PM  Group Topic/Focus:  Wrap-Up Group:   The focus of this group is to help patients review their daily goal of treatment and discuss progress on daily workbooks.  Participation Level:  Active  Participation Quality:  Appropriate  Affect:  Appropriate  Cognitive:  Appropriate  Insight: Appropriate  Engagement in Group:  Engaged  Modes of Intervention:  Discussion and Education  Additional Comments:  Pt shared that in his leisure time he enjoys playing basketball, cooking, cleaning and watching TV.   Humberto SealsWhitaker, Mahealani Sulak Monique 03/03/2014, 9:34 PM

## 2014-03-03 NOTE — ED Provider Notes (Signed)
Medical screening examination/treatment/procedure(s) were performed by non-physician practitioner and as supervising physician I was immediately available for consultation/collaboration.    Kedrick Mcnamee, MD 03/03/14 0606 

## 2014-03-03 NOTE — Consult Note (Signed)
  Patient states that he does drink alcohol but not daily.  Patient states that his main problem is depression that is worsening.  Patient states that he is having suicidal thoughts but no plan.  "I'm just getting tired of watching the news, no job, these hip replacements.  I have just never been able to get back on top."  Patient rates his depression 10/10.  Disposition:  Inpatient treatment recommended. [facility]  Will continue to monitor for safety and stabilization until [facility].  Teresina Bugaj B. Sharolyn Weber FNP-BC

## 2014-03-03 NOTE — Progress Notes (Signed)
[facility] provided pt with a [facility] Orange Card application, highlighting [facility], to help patient establish primary care.  °

## 2014-03-03 NOTE — ED Notes (Signed)
Adult Psychoeducational Group Note  Date:  03/03/2014 Time:  12:17 PM  Group Topic/Focus:  Overcoming Stress:   The focus of this group is to define stress and help patients assess their triggers.  Participation Level:  Active  Participation Quality:  Appropriate  Affect:  Appropriate  Cognitive:  Alert  Insight: Limited  Engagement in Group:  Limited  Modes of Intervention:  Education  Additional Comments:  Patient participated in the group on stress. Patient stated reasons for stress as not having a job and his living situation. Patient stated he did not having any coping skills or hobbies and typically drank to relieve stress. It was discussed with patient that drinking alcohol was not a positive coping skill, but was offered other methods like talking a walk, meditating, listening to music, watching tv, reading, etc. Patient was encouraged to think about what was discussed in group and think of coping skills that would personally work for him.  Merleen MillinerCataldo, Larry Salinas 03/03/2014, 12:17 PM

## 2014-03-03 NOTE — Progress Notes (Signed)
CSW met with patient to discuss voluntary transfer to [facility]. CSW clearly explained the consent forms to the patient and he willingly signed. Patient is currently in agreement with transfer and treatment options.    Chesley Noon, MSW, LCSWA, 03/03/2014, 5:00 PM Evening Clinical Social Worker [phone]

## 2014-03-03 NOTE — ED Provider Notes (Signed)
Per Larry Salinas with [facility], patient would benefit from admission and is accepted at [facility] pending bed availability.  Larry HookerShari A Marlinda Miranda, PA-C 03/03/14 [phone]

## 2014-03-03 NOTE — Tx Team (Signed)
Initial Interdisciplinary Treatment Plan  PATIENT STRENGTHS: (choose at least two) Ability for insight Communication skills General fund of knowledge Motivation for treatment/growth Special hobby/interest Supportive family/friends  PATIENT STRESSORS: Financial difficulties Occupational concerns   PROBLEM LIST: Problem List/Patient Goals Date to be addressed Date deferred Reason deferred Estimated date of resolution  Increased depression over the last month 03/03/2014     Suicidal ideations with plan to shoot self 03/03/2014     Medications management 03/03/2014                                          DISCHARGE CRITERIA:  Ability to meet basic life and health needs Improved stabilization in mood, thinking, and/or behavior Motivation to continue treatment in a less acute level of care Need for constant or close observation no longer present Safe-care adequate arrangements made  PRELIMINARY DISCHARGE PLAN: Attend aftercare/continuing care group Return to previous living arrangement  PATIENT/FAMIILY INVOLVEMENT: This treatment plan has been presented to and reviewed with the patient, Marland McalpineJeffrey L Pulcini.  The patient and family have been given the opportunity to ask questions and make suggestions.  Angeline SlimHill, Diamon Reddinger M 03/03/2014, 7:47 PM

## 2014-03-03 NOTE — Progress Notes (Signed)
Patient ID: Larry FriesJeffrey L Salinas, male   DOB: [birth year], 53 y.o.   MRN: [id number] 53 y/o male who presents voluntarily for increased depression times 1 month and suicidal ideation with a plan to shoot self.  Patient states he does not think he would go through with it but states he does not own a gun but states he could get access to one.  Patient presents sad and depressed but brightens when talking to Clinical research associatewriter.  Patient states his stressors are his work and finances.  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  Patient states he has been sitting in the house depressed with nothing to do and his vehicle is in the shop so he is unable to drive himself anywhere.  Patient states he has not had money often but states he does he buys a 6 pack of beer.  Patient state he has not been drinking beer daily but states more often.  Patient states he used marijuana a few days prior to admission.  Patient states he is passive SI but verbally contracts for safety.  Patient denies HI and denies AVH.  Patient denies HI and denies AVH.  Patient hx obtained, patient stated he had a history of depression.  Patient sx history bilateral hip replacements in 1993.  Patient gait slightly unsteady patient made a moderate fall risk.  Consents obtained, fall safety plan explained and patient verbalized understanding.  Patient escorted and oriented to the unit.  Patient offered no additional questions or concerns.

## 2014-03-03 NOTE — ED Notes (Signed)
Patient alert. States he is still feeling depressed.   Encouragement offered. Given snack and Ambien.  Patient safety maintained, Q 15 checks continue.

## 2014-03-03 NOTE — Progress Notes (Signed)
TTS will refer patient to other facilities this morning.       [facility] [address] [facility] after the [employer] Team meeting at 11am to determine discharges.

## 2014-03-04 DIAGNOSIS — F1994 Other psychoactive substance use, unspecified with psychoactive substance-induced mood disorder: Secondary | ICD-10-CM

## 2014-03-04 DIAGNOSIS — R45851 Suicidal ideations: Secondary | ICD-10-CM

## 2014-03-04 DIAGNOSIS — F10129 Alcohol abuse with intoxication, unspecified: Secondary | ICD-10-CM | POA: Diagnosis present

## 2014-03-04 DIAGNOSIS — F1010 Alcohol abuse, uncomplicated: Secondary | ICD-10-CM

## 2014-03-04 MED ORDER — BUPROPION HCL ER (SR) 100 MG PO TB12
100.0000 mg | ORAL_TABLET | Freq: Every day | ORAL | Status: DC
  Administered 2014-03-04 – 2014-03-07 (×4): 100 mg via ORAL
  Filled 2014-03-04 (×7): qty 1

## 2014-03-04 NOTE — Tx Team (Signed)
Interdisciplinary Treatment Plan Update (Adult)  Date: 03/04/2014  Time Reviewed:  9:45 AM  Progress in Treatment: Attending groups: Yes Participating in groups:  Yes Taking medication as prescribed:  Yes Tolerating medication:  Yes Family/Significant othe contact made: CSW assessing  Patient understands diagnosis:  Yes Discussing patient identified problems/goals with staff:  Yes Medical problems stabilized or resolved:  Yes Denies suicidal/homicidal ideation: Yes Issues/concerns per patient self-inventory:  Yes Other:  New problem(s) identified: N/A  Discharge Plan or Barriers: CSW assessing for appropriate referrals.  Reason for Continuation of Hospitalization: Anxiety Depression Medication Stabilization  Comments: Patient said that he has been depressed most of his life. He clarifies that this has gotten worse over the last month. He has not worked since the end of January. 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 This type of work of course brings pain. He has been having thoughts of killing himself. When asked how, he says "a gun would do it quick." He said that he did not have a gun but could get access to one easily. Pt denies HI or A/V hallucinations.  Patient has been drinking about a 6 pack daily for the last two weeks. He drank less in the time period before then but said that he would not drink much if he has work. Since he has not had work in a few weeks, he is drinking more. Patient says that he has been smoking marijuana occasionally, less than once monthly.  Patient has no outpatient care. He was at [facility] for ETOH rehab over 10 years ago. He says he thinks he would benefit from medication to make him feel less despair. He did take a trazadone from his brother a couple of nights ago and said he liked being able to sleep the night through.  Estimated length of stay: 3-5 days  For review of initial/current patient goals, please see plan of  care.  Attendees: Patient:     Family:     Physician:  Dr. Javier GlazierJohnalagadda 03/04/2014 8:38 AM   Nursing:   Leighton ParodyBritney Tyson, RN 03/04/2014 8:38 AM   Clinical Social Worker:  Reyes Ivanhelsea [name], LCSW 03/04/2014 8:38 AM   Other:  Harold Barbanonecia Byrd, RN 03/04/2014 8:38 AM   Other:  Leisa LenzValerie Enoch, care coordination 03/04/2014 8:38 AM   Other:  Juline PatchQuylle Shya Kovatch, LCSW 03/04/2014 8:38 AM   Other:     Other:    Other:    Other:    Other:    Other:    Other:     Scribe for Treatment Team:   Carmina MillerHorton, Chelsea Nicole, 03/04/2014 8:38 AM

## 2014-03-04 NOTE — [facility] Counselor (Signed)
Adult Comprehensive Assessment  Patient ID: Larry Salinas, male   DOB: Dec 27, 1961, 53 y.o.   MRN: [id number]  Information Source: Information source: Patient  Current Stressors:  Educational / Learning stressors: N/A Employment / Job issues: seasonal work, depressed from Surveyor, minerals around all winter Family Relationships: N/A Surveyor, quantity / Lack of resources (include bankruptcy): finances are tight Housing / Lack of housing: N/A Physical health (include injuries & life threatening diseases): hip replacements Social relationships: N/A Substance abuse: Alcohol and marijuana use Bereavement / Loss: N/A  Living/Environment/Situation:  Living Arrangements: Alone Living conditions (as described by patient or guardian): Pt lives with brother in [place], [place].  Pt reports that this is a good environment.   How long has patient lived in current situation?: 3 years What is atmosphere in current home: Supportive;Loving;Comfortable  Family History:  Marital status: Single Does patient have children?: No  Childhood History:  By whom was/is the patient raised?: Grandparents;Mother Additional childhood history information: Pt reports having a rough childhood due to mother being depressed.  Pt stayed between mother and grandparents.  Description of patient's relationship with caregiver when they were a child: Pt reports getting along well with mother and grandparents growing up.  Patient's description of current relationship with people who raised him/her: Mother ans grandparents are deceased.   Does patient have siblings?: Yes Number of Siblings: 2 Description of patient's current relationship with siblings: Pt reports being close to brothers.   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 How long has patient been employed?: 1 year Patient's job has been impacted by current illness: No What is the longest time patient has a held a job?: 17 years Where was the patient employed at that time?: landscaping Has patient ever been in the [employer]?: No Has patient ever served in Buyer, retail?: No  Financial Resources:   Financial resources: Income from employment;Food stamps Does patient have a representative payee or guardian?: No  Alcohol/Substance Abuse:   What has been your use of drugs/alcohol within the last 12 months?: Marijuana - 1 hit daily for the last year, Alcohol - as much as possible over the last few months If attempted suicide, did drugs/alcohol play a role in this?: No Alcohol/Substance Abuse Treatment Hx: Past Tx, Inpatient;Attends AA/NA If yes, describe treatment: Rehab and AA meetings 15 years ago - can't recall where he went Has alcohol/substance abuse ever caused legal problems?: Yes (DUI's in 3's)  Social Support System:   Patient's Community Support System: Good Describe Community Support System: Pt reports family and girlfriend are supportive Type of faith/religion: Baptist How does patient's faith help to cope with current illness?: watch sermons on TV  Leisure/Recreation:   Leisure and Hobbies: used to enjoy playing basketball  Strengths/Needs:   What things does the patient do well?: hard worker In what areas does patient struggle / problems for patient: Depression, SI  Discharge Plan:   Does patient have access to transportation?: Yes Will patient be returning to same living  situation  after discharge?: Yes Currently receiving community mental health services: No If no, would patient like referral for services when discharged?: Yes (What county?) [facility]([place] Does patient have financial barriers related to discharge medications?: No  Summary/Recommendations:     Patient is a 53 year old Caucasian Male with a diagnosis of Alcohol Abuse and Mood Disorder NOS.  [place] in [place], [place] with [place] brother.  Pt reports being down and depressed due to not being able to work in the winter and being cooped up in the house.  Patient will benefit from crisis stabilization, medication evaluation, group therapy and psycho education in addition to case management for discharge planning.    [name], [name]. 03/04/2014

## 2014-03-04 NOTE — [facility] Suicide Risk Assessment (Signed)
   Nursing information obtained from:  Patient Demographic factors:  Male;Caucasian;Low socioeconomic status;Unemployed Current Mental Status:  Suicide plan;Suicidal ideation indicated by patient Loss Factors:  Financial problems / change in socioeconomic status Historical Factors:    Risk Reduction Factors:    Total Time spent with patient: 45 minutes  CLINICAL FACTORS:   Severe Anxiety and/or Agitation Depression:   Hopelessness Impulsivity Insomnia Recent sense of peace/wellbeing Severe Alcohol/Substance Abuse/Dependencies Chronic Pain Unstable or Poor Therapeutic Relationship Medical Diagnoses and Treatments/Surgeries  Psychiatric Specialty Exam: Physical Exam  Review of Systems  Musculoskeletal: Positive for joint pain, myalgias and neck pain.  Psychiatric/Behavioral: Positive for depression, suicidal ideas, memory loss and substance abuse. The patient is nervous/anxious and has insomnia.     Blood pressure 130/77, pulse 89, temperature 97.8 F (36.6 C), temperature source Oral, resp. rate 16, height 5\' 9"  (1.753 m), weight 70.308 kg (155 lb), SpO2 97.00%.Body mass index is 22.88 kg/(m^2).  General Appearance: Disheveled  Eye Contact::  Good  Speech:  Clear and Coherent  Volume:  Normal  Mood:  Anxious, Depressed, Hopeless and Worthless  Affect:  Depressed and Flat  Thought Process:  Goal Directed and Intact  Orientation:  Full (Time, Place, and Person)  Thought Content:  WDL  Suicidal Thoughts:  Yes.  with intent/plan  Homicidal Thoughts:  No  Memory:  Immediate;   Fair  Judgement:  Fair  Insight:  Fair  Psychomotor Activity:  Psychomotor Retardation  Concentration:  Fair  Recall:  [employer]  Fund of Knowledge:Good  Language: Fair  Akathisia:  NA  Handed:  Right  AIMS (if indicated):     Assets:  Communication Skills Desire for Improvement Financial Resources/Insurance Housing Intimacy Leisure Time Physical Health Resilience Social Support Talents/Skills   Sleep:  Number of Hours: 4   Musculoskeletal: Strength & Muscle Tone: within normal limits Gait & Station: normal Patient leans: Right  COGNITIVE FEATURES THAT CONTRIBUTE TO RISK:  Closed-mindedness Loss of executive function Polarized thinking Thought constriction (tunnel vision)    SUICIDE RISK:   Moderate:  Frequent suicidal ideation with limited intensity, and duration, some specificity in terms of plans, no associated intent, good self-control, limited dysphoria/symptomatology, some risk factors present, and identifiable protective factors, including available and accessible social support.  PLAN OF CARE: Patient admit for crisis stabilization, safety monitoring and medication management for depression and suicidal ideation.   I certify that inpatient services furnished can reasonably be expected to improve the patient's condition.  Kymir Coles,JANARDHAHA R. 03/04/2014, 9:22 AM

## 2014-03-04 NOTE — Progress Notes (Signed)
D: Patient denies SI/HI and A/V hallucinations  A: Monitored q 15 minutes; patient encouraged to attend groups; patient educated about medications; patient given medications per physician orders; patient encouraged to express feelings and/or concerns  R: Patient is pleasant and cooperative; patient is appropriate to circumstances and he is animated; patient's interaction with staff and peers is appropriate; patient was able to set goal to talk with staff 1:1 when having feelings of SI; patient is taking medications as prescribed and tolerating medications; patient is attending most groups

## 2014-03-04 NOTE — ED Provider Notes (Signed)
Medical screening examination/treatment/procedure(s) were performed by non-physician practitioner and as supervising physician I was immediately available for consultation/collaboration.   EKG Interpretation None       Flint MelterElliott L Myliyah Rebuck, MD 03/04/14 [phone]

## 2014-03-04 NOTE — H&P (Signed)
Psychiatric Admission Assessment Adult  Patient Identification:  Larry Salinas Date of Evaluation:  03/04/2014 Chief Complaint:  MDD ETOH History of Present Illness:: Patient was admitted voluntarily and emergently with the increased symptoms of depression and suicidal ideation with the plan to shoot himself. Patient  was presented with alcohol intoxication at [facility] long emergency department. Patient reported he asked his bed and to bring to the hospital to get the help for depression and substance abuse and suicidal ideation. Patient reported his brother has been disabled with multiple medical problems and mental illness. Patient  stated that his depression has gotten worse over the last month. He has  multiple psychosocial stressors secondary to not worked since the end of January, financial difficulties her lack of support from family and friends. 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 He has been having thoughts of killing himself. He said that he did not have a gun but could get access to one easily. Patient has been drinking about a 6 pack daily for the last two weeks. He drank less in the time period before then but said that he would not drink much if he has work. Since he has not had work in a few weeks, he is drinking more. Patient says that he has been smoking marijuana occasionally, less than once monthly. His spasticity positive for substance abuse treatment at [facility] for ETOH rehab over 10 years ago. He did take a trazadone from his brother a couple of nights ago and said he liked being able to sleep the night through. Urine drug screen is positive for marijuana and his blood alcohol level was 303 on admission to the emergency department.  Elements:  Location:  depression and alcohol abuse. Quality:  No psycho social support. Severity:   suicidal ideation with plan. Timing:  No job no money. Duration:  2 months. Associated  Signs/Synptoms: Depression Symptoms:  depressed mood, anhedonia, insomnia, psychomotor retardation, fatigue, feelings of worthlessness/guilt, difficulty concentrating, hopelessness, impaired memory, suicidal thoughts with specific plan, anxiety, loss of energy/fatigue, decreased labido, decreased appetite, (Hypo) Manic Symptoms:  Distractibility, Impulsivity, Anxiety Symptoms:  Excessive Worry, Psychotic Symptoms:  Denied  PTSD Symptoms: NA Total Time spent with patient: 45 minutes  Psychiatric Specialty Exam: Physical Exam  Review of Systems  Musculoskeletal: Positive for joint pain, myalgias and neck pain.  Psychiatric/Behavioral: Positive for depression, suicidal ideas, memory loss and substance abuse. The patient is nervous/anxious and has insomnia.   All other systems reviewed and are negative.    Blood pressure 130/77, pulse 89, temperature 97.8 F (36.6 C), temperature source Oral, resp. rate 16, height 5' 9"  (1.753 m), weight 70.308 kg (155 lb), SpO2 97.00%.Body mass index is 22.88 kg/(m^2).  General Appearance: Disheveled and Guarded  Eye Contact::  Minimal  Speech:  Clear and Coherent and Normal Rate  Volume:  Decreased  Mood:  Anxious, Depressed, Hopeless and Worthless  Affect:  Constricted and Depressed  Thought Process:  Goal Directed and Intact  Orientation:  Full (Time, Place, and Person)  Thought Content:  WDL  Suicidal Thoughts:  Yes.  with intent/plan  Homicidal Thoughts:  No  Memory:  Immediate;   Fair  Judgement:  Impaired  Insight:  Fair  Psychomotor Activity:  Psychomotor Retardation  Concentration:  Fair  Recall:  [place] of Knowledge:Good  Language: NA  Akathisia:  NA  Handed:  Right  AIMS (if indicated):     Assets:  Communication Skills  Desire for Improvement Intimacy Physical Health Resilience Social Support  Sleep:  Number of Hours: 4    Musculoskeletal: Strength & Muscle Tone: within normal limits Gait & Station:  normal Patient leans: N/A  Past Psychiatric History: Diagnosis:  Hospitalizations:  Outpatient Care:  Substance Abuse Care:  Self-Mutilation:  Suicidal Attempts:  Violent Behaviors:   Past Medical History:   Past Medical History  Diagnosis Date  . Depression    None. Allergies:  No Known Allergies PTA Medications: Prescriptions prior to admission  Medication Sig Dispense Refill  . Aspirin-Salicylamide-Caffeine (BC HEADACHE POWDER PO) Take by mouth. Use as directed. "Arthritis strength."        Previous Psychotropic Medications:  Medication/Dose                 Substance Abuse History in the last 12 months:  yes  Consequences of Substance Abuse: NA  Social History:  reports that he has been smoking Cigarettes.  He has been smoking about 1.00 pack per day. He has never used smokeless tobacco. He reports that he drinks alcohol. He reports that he does not use illicit drugs. Additional Social History: Pain Medications: none Prescriptions: n/a Over the Counter: b/c powders History of alcohol / drug use?: Yes Name of Substance 1: etoh 1 - Age of First Use: 53 years old 1 - Amount (size/oz): 6 pack if he can pay for it 1 - Frequency: daily or when ever he has money 1 - Duration: drinking more the last two weeka 1 - Last Use / Amount: 03/02/2014 40 oz Name of Substance 2: marijuana 2 - Age of First Use: tens 2 - Amount (size/oz): when money is available 2 - Frequency: 1 time a month 2 - Duration: ongoing 2 - Last Use / Amount: a few days ago                Current Place of Residence:   Place of Birth:   Family Members: Marital Status:  Single Children:  Sons:  Daughters: Relationships: Education:  Dentist Problems/Performance: Religious Beliefs/Practices: History of Abuse (Emotional/Phsycial/Sexual) Occupational Experiences; Military History:  None. Legal History: Hobbies/Interests:  Family History:  History reviewed. No pertinent  family history.  Results for orders placed during the hospital encounter of 03/02/14 (from the past 72 hour(s))  URINE RAPID DRUG SCREEN (HOSP PERFORMED)     Status: Abnormal   Collection Time    03/02/14  4:33 PM      Result Value Ref Range   Opiates NONE DETECTED  NONE DETECTED   Cocaine NONE DETECTED  NONE DETECTED   Benzodiazepines NONE DETECTED  NONE DETECTED   Amphetamines NONE DETECTED  NONE DETECTED   Tetrahydrocannabinol POSITIVE (*) NONE DETECTED   Barbiturates NONE DETECTED  NONE DETECTED   Comment:            DRUG SCREEN FOR MEDICAL PURPOSES     ONLY.  IF CONFIRMATION IS NEEDED     FOR ANY PURPOSE, NOTIFY LAB     WITHIN 5 DAYS.                LOWEST DETECTABLE LIMITS     FOR URINE DRUG SCREEN     Drug Class       Cutoff (ng/mL)     Amphetamine      1000     Barbiturate      200     Benzodiazepine   992     Tricyclics  300     Opiates          300     Cocaine          300     THC              50  ACETAMINOPHEN LEVEL     Status: None   Collection Time    03/02/14  4:35 PM      Result Value Ref Range   Acetaminophen (Tylenol), Serum <15.0  10 - 30 ug/mL   Comment:            THERAPEUTIC CONCENTRATIONS VARY     SIGNIFICANTLY. A RANGE OF 10-30     ug/mL MAY BE AN EFFECTIVE     CONCENTRATION FOR MANY PATIENTS.     HOWEVER, SOME ARE BEST TREATED     AT CONCENTRATIONS OUTSIDE THIS     RANGE.     ACETAMINOPHEN CONCENTRATIONS     >150 ug/mL AT 4 HOURS AFTER     INGESTION AND >50 ug/mL AT 12     HOURS AFTER INGESTION ARE     OFTEN ASSOCIATED WITH TOXIC     REACTIONS.  CBC     Status: Abnormal   Collection Time    03/02/14  4:35 PM      Result Value Ref Range   WBC 9.8  4.0 - 10.5 K/uL   RBC 4.72  4.22 - 5.81 MIL/uL   Hemoglobin 16.5  13.0 - 17.0 g/dL   HCT 46.9  39.0 - 52.0 %   MCV 99.4  78.0 - 100.0 fL   MCH 35.0 (*) 26.0 - 34.0 pg   MCHC 35.2  30.0 - 36.0 g/dL   RDW 13.7  11.5 - 15.5 %   Platelets 224  150 - 400 K/uL  COMPREHENSIVE METABOLIC PANEL      Status: Abnormal   Collection Time    03/02/14  4:35 PM      Result Value Ref Range   Sodium 136 (*) 137 - 147 mEq/L   Potassium 3.9  3.7 - 5.3 mEq/L   Chloride 96  96 - 112 mEq/L   CO2 21  19 - 32 mEq/L   Glucose, Bld 105 (*) 70 - 99 mg/dL   BUN 7  6 - 23 mg/dL   Creatinine, Ser 0.82  0.50 - 1.35 mg/dL   Calcium 9.6  8.4 - 10.5 mg/dL   Total Protein 8.3  6.0 - 8.3 g/dL   Albumin 4.3  3.5 - 5.2 g/dL   AST 23  0 - 37 U/L   ALT 23  0 - 53 U/L   Alkaline Phosphatase 121 (*) 39 - 117 U/L   Total Bilirubin 0.3  0.3 - 1.2 mg/dL   GFR calc non Af Amer >90  >90 mL/min   GFR calc Af Amer >90  >90 mL/min   Comment: (NOTE)     The eGFR has been calculated using the CKD EPI equation.     This calculation has not been validated in all clinical situations.     eGFR's persistently <90 mL/min signify possible Chronic Kidney     Disease.  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 Depression    AXIS IV:  economic problems, occupational problems, other psychosocial or environmental problems, problems related to social  environment and problems with primary support group AXIS V:  41-50 serious symptoms  Treatment Plan/Recommendations:  Admit for crisis stabilization, safety monitoring and medication management for depression and suicidal ideations.   Treatment Plan Summary: Daily contact with patient to assess and evaluate symptoms and progress in treatment Medication management Current Medications:  Current Facility-Administered Medications  Medication Dose Route Frequency Provider Last Rate Last Dose  . buPROPion [facility] SR) 12 hr tablet 100 mg  100 mg Oral Daily Durward Parcel, MD      . ibuprofen (ADVIL,MOTRIN) tablet 600 mg  600 mg Oral Q6H PRN Benjamine Mola, FNP   600 mg at 03/04/14 0839  . LORazepam (ATIVAN) tablet 1 mg  1 mg Oral Q8H PRN Shuvon Rankin, NP   1 mg at 03/04/14 0839  . magnesium hydroxide (MILK OF MAGNESIA) suspension 30 mL  30 mL Oral Daily PRN Shuvon Rankin, NP      . nicotine (NICODERM CQ - dosed in mg/24 hours) patch 21 mg  21 mg Transdermal Daily Shuvon Rankin, NP   21 mg at 03/04/14 0839  . sodium chloride (OCEAN) 0.65 % nasal spray 1 spray  1 spray Each Nare Q4H PRN Benjamine Mola, FNP      . traZODone (DESYREL) tablet 50 mg  50 mg Oral QHS PRN,MR X 1 Benjamine Mola, FNP   50 mg at 03/03/14 2253    Observation Level/Precautions:  15 minute checks  Laboratory:  reviewed admission labs  Psychotherapy:  CBT, supportive therapy and interpersonal psychotherapy  Medications:  Wellbutrin SR 100 mg QD, trazodone 50 mg Qhs/PRN for insomnia  Consultations: none   Discharge Concerns:  safety  Estimated LOS: 4-7 days  Other:     I certify that inpatient services furnished can reasonably be expected to improve the patient's condition.   Estus Krakowski,JANARDHAHA R. 3/6/20159:28 AM

## 2014-03-04 NOTE — [facility] Suicide Risk Assessment (Signed)
[facility] INPATIENT:  Family/Significant Other Suicide Prevention Education  Suicide Prevention Education:  Education Completed; Loss adjuster, charteredMallorey Salinas - brother [phone]),  (name of family member/significant other) has been identified by the patient as the family member/significant other with whom the patient will be residing, and identified as the person(s) who will aid the patient in the event of a mental health crisis (suicidal ideations/suicide attempt).  With written consent from the patient, the family member/significant other has been provided the following suicide prevention education, prior to the and/or following the discharge of the patient.  The suicide prevention education provided includes the following:  Suicide risk factors  Suicide prevention and interventions  National Suicide Hotline telephone number  [facility] assessment telephone number  [facility] Emergency Assistance 911  [facility] and/or Residential Mobile Crisis Unit telephone number  Request made of family/significant other to:  Remove weapons (e.g., guns, rifles, knives), all items previously/currently identified as safety concern.    Remove drugs/medications (over-the-counter, prescriptions, illicit drugs), all items previously/currently identified as a safety concern.  The family member/significant other verbalizes understanding of the suicide prevention education information provided.  The family member/significant other agrees to remove the items of safety concern listed above.  Larry MillerHorton, Anvita Hirata Nicole 03/04/2014, 3:31 PM

## 2014-03-04 NOTE — [facility] Group Notes (Signed)
[facility] LCSW Group Therapy  03/04/2014 3:24 PM  Type of Therapy:  Group Therapy  Participation Level:  Patient entered group as it was ending.  Wynn BankerHodnett, Alp Goldwater Hairston 03/04/2014, 3:24 PM

## 2014-03-04 NOTE — Progress Notes (Signed)
Adult Psychoeducational Group Note  Date:  03/04/2014 Time:  9:10 PM  Group Topic/Focus:  Goals Group:   The focus of this group is to help patients establish daily goals to achieve during treatment and discuss how the patient can incorporate goal setting into their daily lives to aide in recovery.  Participation Level:  Active  Participation Quality:  Appropriate  Affect:  Appropriate  Cognitive:  Appropriate  Insight: Appropriate  Engagement in Group:  Engaged  Modes of Intervention:  Discussion  Additional Comments:  Pt stated that he talked with his family and boss and took care of some things outside of facility so that when he is DC he has some stability.  Terie PurserParker, Leah Skora R 03/04/2014, 9:10 PM

## 2014-03-05 DIAGNOSIS — F1910 Other psychoactive substance abuse, uncomplicated: Secondary | ICD-10-CM

## 2014-03-05 DIAGNOSIS — F411 Generalized anxiety disorder: Secondary | ICD-10-CM

## 2014-03-05 MED ORDER — QUETIAPINE FUMARATE 25 MG PO TABS
25.0000 mg | ORAL_TABLET | Freq: Two times a day (BID) | ORAL | Status: DC
  Administered 2014-03-05 – 2014-03-07 (×5): 25 mg via ORAL
  Filled 2014-03-05 (×11): qty 1

## 2014-03-05 MED ORDER — TRAZODONE HCL 100 MG PO TABS
100.0000 mg | ORAL_TABLET | Freq: Every day | ORAL | Status: DC
  Administered 2014-03-05: 100 mg via ORAL
  Filled 2014-03-05 (×4): qty 1

## 2014-03-05 MED ORDER — QUETIAPINE FUMARATE 25 MG PO TABS
ORAL_TABLET | ORAL | Status: AC
  Filled 2014-03-05: qty 1

## 2014-03-05 NOTE — Progress Notes (Signed)
Pt reports he is doing better this evening.  He denies SI/HI/AV.  He says he has been going to groups and attended group tonight.  He says he gets depressed during the winter months because his work slows down and he is not able to work outside.  Pt is reporting no withdrawal symptoms at this time.  Pt is cooperative with staff and interacts appropriately with staff and peers.  Pt makes his needs known to staff and voices no needs or concerns at this time.  Support and encouragement offered.  Safety maintained with q15 minute checks.

## 2014-03-05 NOTE — [facility] Group Notes (Signed)
[facility] Group Notes:  (Clinical Social Work)  08/21/2013   1:15-2:15PM  Summary of Progress/Problems:   The main focus of today's process group was for the patient to identify ways in which they sabotage their own mental health wellness/recovery.  The group worked together to develop a list of their self-sabotaging behaviors.  They then described the reasons they engage in self-sabotaging behavior that is detrimental to wellness, and the short-term benefit or result derived from it.  Motivational interviewing by CSW was used to explore long-term repercussions of poor coping skills and reasons for wanting to change, in addition to the costs of changing.  A handout was given to facilitate patients doing this exercise specifically for themselves.  A handout on Stages of Change was also used and the patient identified themselves in the Preparation Stage.  Type of Therapy:  Process Group  Participation Level:  Active  Participation Quality:  Attentive and Sharing  Affect:  Appropriate  Cognitive:  Appropriate and Oriented  Insight:  Engaged  Engagement in Therapy:  Engaged  Modes of Intervention:  Education, Motivational Interviewing   Surveyor, mineralsMareida Grossman-Orr, LCSW 4:26 PM

## 2014-03-05 NOTE — Progress Notes (Signed)
The focus of this group is to help patients review their daily goal of treatment and discuss progress on daily workbooks. Pt attended the evening group and responded to all discussion prompts from the Writer. Pt reported having had a good day, the highlights of which were the daytime RN group, which he referenced the whiteboard to talk about, and being able to go outside to the courtyard. Pt reported having no additional needs from Nursing Staff this evening. Pt's affect was upbeat and he displayed a positive sense of humor.

## 2014-03-05 NOTE — Progress Notes (Signed)
Patient ID: Larry Salinas, male   DOB: 11/15/1961, 53 y.o.   MRN: [id number]  D: Patient pleasant and cooperative with staff, bright affect on approach. CIWA = 0. Pt sociable with peers on unit. A: Q 15 minute safety checks, encourage staff/peer interaction and group participation. Administer medications as ordered. R: Pt denies SI or plans to harm himself. Pt participating in group sessions and is compliant with medications. No distress noted at this time.

## 2014-03-05 NOTE — Progress Notes (Signed)
[facility]  03/05/2014 6:07 PM Larry Salinas  MRN:  [id number] Subjective:  Patient was admitted voluntarily and emergently with the increased symptoms of depression and suicidal ideation with the plan to shoot himself. Patient [facility]. Patient reported he asked his bed and to bring to the hospital to get the help for depression and substance abuse and suicidal ideation. Patient reported his brother has been disabled with multiple medical problems and mental illness. Patient stated that his depression has gotten worse over the last month. He has multiple psychosocial stressors secondary to not worked since the end of January, financial difficulties her lack of support from family and friends. 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 He has been having thoughts of killing himself. He said that he did not have a gun but could get access to one easily. Patient has been drinking about a 6 pack daily for the last two weeks. He drank less in the time period before then but said that he would not drink much if he has work. Since he has not had work in a few weeks, he is drinking more. Patient says that he has been smoking marijuana occasionally, less than once monthly. His spasticity positive for substance abuse treatment at [facility] for ETOH rehab over 10 years ago. He did take a trazadone from his brother a couple of nights ago and said he liked being able to sleep the night through. Urine drug screen is positive for marijuana and his blood alcohol level was 303 on admission to the emergency department.  During today's assessment, pt rates anxiety at 10/10 and depression at 5/10. Pt states that he "cannot stop the racing thoughts and keep going on and on, working me up. I can't stay focused because the thoughts are neverending, jumping from topic to topic." Pt denies SI, HI, and AVH,  contacts for safety. Pt asked if he had ADHD, and it was explained to pt that he has multiple psychological factors contributing to his anxiety and racing thoughts, including alcohol and marijuana.   Diagnosis:   DSM5:  Substance/Addictive Disorders:  Alcohol Intoxication with Use Disorder - Severe (F10.229) Depressive Disorders:  Major Depressive Disorder - Severe (296.23) Total Time spent with patient: Greater than 25 minutes  Axis I: Alcohol Abuse, Generalized Anxiety Disorder, Major Depression, Recurrent severe and Substance Abuse Axis II: Deferred Axis III:  Past Medical History  Diagnosis Date  . Depression    Axis IV: other psychosocial or environmental problems and problems related to social environment Axis V: 41-50 serious symptoms  ADL's:  Intact  Sleep: Fair  Appetite:  Good  Suicidal Ideation:  Denies Homicidal Ideation:  Denies AEB (as evidenced by):  Psychiatric Specialty Exam: Physical Exam  Review of Systems  Constitutional: Negative.   HENT: Negative.   Eyes: Negative.   Respiratory: Negative.   Cardiovascular: Negative.   Gastrointestinal: Negative.   Genitourinary: Negative.   Musculoskeletal: Negative.   Skin: Negative.   Neurological: Negative.   Endo/Heme/Allergies: Negative.   Psychiatric/Behavioral: Positive for depression. The patient is nervous/anxious.     Blood pressure 115/76, pulse 79, temperature 98.6 F (37 C), temperature source Oral, resp. rate 16, height 5\' 9"  (1.753 m), weight 70.308 kg (155 lb), SpO2 97.00%.Body mass index is 22.88 kg/(m^2).  General Appearance: Casual  Eye Contact::  Good  Speech:  Pressured  Volume:  Increased  Mood:  Anxious  Affect:  Blunt  Thought Process:  Disorganized, Loose and Tangential  Orientation:  Full (Time, Place, and Person)  Thought Content:  WDL  Suicidal Thoughts:  No  Homicidal Thoughts:  No  Memory:  Immediate;   Good Recent;   Good Remote;   Good  Judgement:  Fair  Insight:   Fair  Psychomotor Activity:  Increased  Concentration:  Poor  Recall:  Fair  Fund of Knowledge:Fair  Language: Good  Akathisia:  NA  Handed:  Right  AIMS (if indicated):     Assets:  Communication Skills Desire for Improvement Resilience  Sleep:  Number of Hours: 4.5   Musculoskeletal: Strength & Muscle Tone: within normal limits Gait & Station: normal Patient leans: N/A  Current Medications: Current Facility-Administered Medications  Medication Dose Route Frequency Provider Last Rate Last Dose  . buPROPion [facility] SR) 12 hr tablet 100 mg  100 mg Oral Daily Nehemiah Settle, MD   100 mg at 03/05/14 0756  . ibuprofen (ADVIL,MOTRIN) tablet 600 mg  600 mg Oral Q6H PRN Beau Fanny, FNP   600 mg at 03/05/14 1512  . LORazepam (ATIVAN) tablet 1 mg  1 mg Oral Q8H PRN Shuvon Rankin, NP   1 mg at 03/04/14 0839  . magnesium hydroxide (MILK OF MAGNESIA) suspension 30 mL  30 mL Oral Daily PRN Shuvon Rankin, NP      . nicotine (NICODERM CQ - dosed in mg/24 hours) patch 21 mg  21 mg Transdermal Daily Shuvon Rankin, NP   21 mg at 03/05/14 0754  . sodium chloride (OCEAN) 0.65 % nasal spray 1 spray  1 spray Each Nare Q4H PRN Beau Fanny, FNP      . traZODone (DESYREL) tablet 50 mg  50 mg Oral QHS PRN,MR X 1 Beau Fanny, FNP   50 mg at 03/04/14 2202    Lab Results: No results found for this or any previous visit (from the past 48 hour(s)).  Physical Findings: AIMS: Facial and Oral Movements Muscles of Facial Expression: None, normal Lips and Perioral Area: None, normal Jaw: None, normal Tongue: None, normal,Extremity Movements Upper (arms, wrists, hands, fingers): None, normal Lower (legs, knees, ankles, toes): None, normal, Trunk Movements Neck, shoulders, hips: None, normal, Overall Severity Severity of abnormal movements (highest score from questions above): None, normal Incapacitation due to abnormal movements: None, normal Patient's awareness of abnormal movements  (rate only patient's report): No Awareness, Dental Status Current problems with teeth and/or dentures?: No Does patient usually wear dentures?: No  CIWA:  CIWA-Ar Total: 1 COWS:     Treatment Plan Summary: Daily contact with patient to assess and evaluate symptoms and progress in treatment Medication management  Plan: Review of chart, vital signs, medications, and notes.  1-Individual and group therapy  2-Medication management for depression and anxiety: Medications reviewed with the patient and she stated no untoward effects. -Add Seroquel 25mg  bid for racing thoughts and severe anxiety -Increase Trazodone to 100mg  qhs for insomnia 3-Coping skills for depression, anxiety  4-Continue crisis stabilization and management  5-Address health issues--monitoring vital signs, stable  6-Treatment plan in progress to prevent relapse of depression and anxiety  Medical Decision Making Problem Points:  Established problem, worsening (2), Review of last therapy session (1) and Review of psycho-social stressors (1) Data Points:  Review or order clinical lab tests (1) Review or order medicine tests (1) Review of medication regiment & side effects (2) Review of new medications or change in dosage (2)  I certify that inpatient services furnished can reasonably be expected to improve the patient's condition.   Beau Fanny, FNP-BC 03/05/2014, 6:07 PM I agreed with findings and treatment plan of this patient

## 2014-03-05 NOTE — Progress Notes (Signed)
Adult Psychoeducational Group Note  Date:  03/05/2014 Time:  3:15PM  Group Topic/Focus:  Self Care:   The focus of this group is to help patients understand the importance of self-care in order to improve or restore emotional, physical, spiritual, interpersonal, and financial health.  Participation Level:  Active  Participation Quality:  Appropriate and Attentive  Affect:  Appropriate  Cognitive:  Appropriate  Insight: Appropriate  Engagement in Group:  Engaged  Modes of Intervention:  Activity  Additional Comments:  Staff facilitated a group session and used a therapeutic activity as a means to strengthen interpersonal skills and combat depression with laughter.    Zacarias PontesSmith, Tyrene Nader R 03/05/2014, 5:14 PM

## 2014-03-05 NOTE — Progress Notes (Signed)
Patient has been up and active on the unit, interacting appropriately with peers. Writer observed him up in the dayroom laughing and talking with peers. Patient currently c/o having pain on his ankle and requested heat pack which he received. He currently denies -si/hi/a/v hall. Support and encouragement offered, safety maintained on unit, will continue to monitor.

## 2014-03-05 NOTE — [facility] Group Notes (Signed)
[facility] Group Notes:  (Nursing/MHT/Case Management/Adjunct)  Date:  03/05/2014  Time:  9:44 AM  Type of Therapy:  Nurse Education  Participation Level:  Active  Participation Quality:  Appropriate and Attentive  Affect:  Appropriate  Cognitive:  Alert, Appropriate and Oriented  Insight:  Appropriate and Good  Engagement in Group:  Engaged  Modes of Intervention:  Discussion and Education  Summary of Progress/Problems: Sleep improvement discussed. Pt states he had difficulty sleeping last night because of his roommate's health concerns. Pt is, however, alert and pleasant with staff/peers on unit.   Renaee MundaSadler, Larry Salinas 03/05/2014, 9:44 AM

## 2014-03-06 MED ORDER — TRAZODONE HCL 150 MG PO TABS
75.0000 mg | ORAL_TABLET | Freq: Every day | ORAL | Status: DC
  Administered 2014-03-06: 75 mg via ORAL
  Filled 2014-03-06 (×3): qty 0.5

## 2014-03-06 NOTE — Progress Notes (Signed)
Adult Psychoeducational Group Note  Date:  03/06/2014 Time:  0930  Group Topic/Focus:  Self Inventory  Participation Level:  Active  Participation Quality:  Appropriate and Attentive  Affect:  Appropriate  Cognitive:  Alert, Appropriate and Oriented  Insight: Appropriate  Engagement in Group:  Engaged  Modes of Intervention:  Clarification and Support  Additional Comments:  Pt is attentive and expresses his needs.  Roylee Chaffin Shari Prowsvan 03/06/2014, 10:04 AM

## 2014-03-06 NOTE — Progress Notes (Signed)
Patient ID: Larry Salinas, male   DOB: [birth year], 53 y.o.   MRN: [id number] [facility] MD Progress Note  03/06/2014 1:35 PM [name]  MRN:  [id number] Subjective:  Patient was admitted voluntarily and emergently with the increased symptoms of depression and suicidal ideation with the plan to shoot himself. Patient was presented with alcohol intoxication at [facility] emergency department. Patient reported he asked his bed and to bring to the hospital to get the help for depression and substance abuse and suicidal ideation. Patient reported his brother has been disabled with multiple medical problems and mental illness. Patient stated that his depression has gotten worse over the last month. He has multiple psychosocial stressors secondary to not worked since the end of January, financial difficulties her lack of support from family and friends. 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 He has been having thoughts of killing himself. He said that he did not have a gun but could get access to one easily. Patient has been drinking about a 6 pack daily for the last two weeks. He drank less in the time period before then but said that he would not drink much if he has work. Since he has not had work in a few weeks, he is drinking more. Patient says that he has been smoking marijuana occasionally, less than once monthly. His spasticity positive for substance abuse treatment at [facility] for ETOH rehab over 10 years ago. He did take a trazadone from his brother a couple of nights ago and said he liked being able to sleep the night through. Urine drug screen is positive for marijuana and his blood alcohol level was 303 on admission to the emergency department.  During today's assessment, pt rates anxiety at 2/10 and depression at 0/10. Pt states that the "Seroquel has helped my racing thoughts a lot. I feel a lot better and more calm". Pt denies SI, HI, and AVH,  contracts for safety. Pt confirms that he slept much better with the Trazodone increase to 75mg  and that he feels well-rested. Pt continues to participate in group therapy and is looking forward to discharging to outpatient therapy within the next few days.   Diagnosis:   DSM5:  Substance/Addictive Disorders:  Alcohol Intoxication with Use Disorder - Severe (F10.229) Depressive Disorders:  Major Depressive Disorder - Severe (296.23) Total Time spent with patient: Greater than 25 minutes  Axis I: Alcohol Abuse, Generalized Anxiety Disorder, Major Depression, Recurrent severe and Substance Abuse Axis II: Deferred Axis III:  Past Medical History  Diagnosis Date  . Depression    Axis IV: other psychosocial or environmental problems and problems related to social environment Axis V: 41-50 serious symptoms  ADL's:  Intact  Sleep: Fair  Appetite:  Good  Suicidal Ideation:  Denies Homicidal Ideation:  Denies AEB (as evidenced by):  Psychiatric Specialty Exam: Physical Exam  Review of Systems  Constitutional: Negative.   HENT: Negative.   Eyes: Negative.   Respiratory: Negative.   Cardiovascular: Negative.   Gastrointestinal: Negative.   Genitourinary: Negative.   Musculoskeletal: Negative.   Skin: Negative.   Neurological: Negative.   Endo/Heme/Allergies: Negative.   Psychiatric/Behavioral: Positive for depression. The patient is nervous/anxious.     Blood pressure 118/78, pulse 75, temperature 97.8 F (36.6 C), temperature source Oral, resp. rate 21, height 5\' 9"  (1.753 m), weight 70.308 kg (155 lb), SpO2 97.00%.Body mass index is 22.88 kg/(m^2).  General Appearance: Casual  Eye  Contact::  Good  Speech:  Pressured  Volume:  Increased  Mood:  Anxious  Affect:  Blunt  Thought Process:  Coherent  Orientation:  Full (Time, Place, and Person)  Thought Content:  WDL  Suicidal Thoughts:  No  Homicidal Thoughts:  No  Memory:  Immediate;   Good Recent;   Good Remote;    Good  Judgement:  Fair  Insight:  Fair  Psychomotor Activity:  Normal  Concentration:  Poor  Recall:  Fair  Fund of Knowledge:Fair  Language: Good  Akathisia:  NA  Handed:  Right  AIMS (if indicated):     Assets:  Communication Skills Desire for Improvement Resilience  Sleep:  Number of Hours: 4.25   Musculoskeletal: Strength & Muscle Tone: within normal limits Gait & Station: normal Patient leans: N/A  Current Medications: Current Facility-Administered Medications  Medication Dose Route Frequency Provider Last Rate Last Dose  . buPROPion [facility](WELLBUTRIN SR) 12 hr tablet 100 mg  100 mg Oral Daily Nehemiah SettleJanardhaha R Jonnalagadda, MD   100 mg at 03/06/14 0817  . ibuprofen (ADVIL,MOTRIN) tablet 600 mg  600 mg Oral Q6H PRN Beau FannyJohn C Withrow, FNP   600 mg at 03/06/14 0818  . LORazepam (ATIVAN) tablet 1 mg  1 mg Oral Q8H PRN Shuvon Rankin, NP   1 mg at 03/04/14 0839  . magnesium hydroxide (MILK OF MAGNESIA) suspension 30 mL  30 mL Oral Daily PRN Shuvon Rankin, NP      . nicotine (NICODERM CQ - dosed in mg/24 hours) patch 21 mg  21 mg Transdermal Daily Shuvon Rankin, NP   21 mg at 03/06/14 0800  . QUEtiapine (SEROQUEL) tablet 25 mg  25 mg Oral BID Beau FannyJohn C Withrow, FNP   25 mg at 03/06/14 0817  . sodium chloride (OCEAN) 0.65 % nasal spray 1 spray  1 spray Each Nare Q4H PRN Beau FannyJohn C Withrow, FNP      . traZODone (DESYREL) tablet 75 mg  75 mg Oral Q2200 Beau FannyJohn C Withrow, FNP        Lab Results: No results found for this or any previous visit (from the past 48 hour(s)).  Physical Findings: AIMS: Facial and Oral Movements Muscles of Facial Expression: None, normal Lips and Perioral Area: None, normal Jaw: None, normal Tongue: None, normal,Extremity Movements Upper (arms, wrists, hands, fingers): None, normal Lower (legs, knees, ankles, toes): None, normal, Trunk Movements Neck, shoulders, hips: None, normal, Overall Severity Severity of abnormal movements (highest score from questions above): None,  normal Incapacitation due to abnormal movements: None, normal Patient's awareness of abnormal movements (rate only patient's report): No Awareness, Dental Status Current problems with teeth and/or dentures?: No Does patient usually wear dentures?: No  CIWA:  CIWA-Ar Total: 0 COWS:     Treatment Plan Summary: Daily contact with patient to assess and evaluate symptoms and progress in treatment Medication management  Plan: Review of chart, vital signs, medications, and notes.  1-Individual and group therapy  2-Medication management for depression and anxiety: Medications reviewed with the patient and she stated no untoward effects. -Add Seroquel 25mg  bid for racing thoughts and severe anxiety -Increase Trazodone to 100mg  qhs for insomnia 3-Coping skills for depression, anxiety  4-Continue crisis stabilization and management  5-Address health issues--monitoring vital signs, stable  6-Treatment plan in progress to prevent relapse of depression and anxiety  Medical Decision Making Problem Points:  Established problem, stable/improving (1), Review of last therapy session (1) and Review of psycho-social stressors (1) Data Points:  Review or order  clinical lab tests (1) Review or order medicine tests (1) Review of medication regiment & side effects (2) Review of new medications or change in dosage (2)  I certify that inpatient services furnished can reasonably be expected to improve the patient's condition.   Beau Fanny, FNP-BC 03/06/2014, 1:35 PM I agreed with findings and treatment plan of this patient

## 2014-03-06 NOTE — Progress Notes (Signed)
D.  Pt. Pleasant and cooperative.  Attending groups.  Denies SI/HI and denies A/V hallucinations. Reports feeling much  Better today.  Rated hopelessness and depression as a 1 today. A.  Encouragement and support given. R.  Pt. Receptive.

## 2014-03-06 NOTE — [facility] Group Notes (Signed)
[facility] Group Notes:  (Clinical Social Work)  03/06/2014   1:15-2:15PM  Summary of Progress/Problems:  The main focus of today's process group was to identify the patient's current support system and decide on other supports that can be put in place.  The picture on workbook was used to discuss why additional supports are needed.  An emphasis was placed on using counselor, doctor, therapy groups, 12-step groups, and problem-specific support groups to expand supports.   There was also an extensive discussion about what constitutes a healthy support versus an unhealthy support.  The patient expressed full comprehension of the concepts presented, and agreed that there is a need to add more supports.  The patient stated the current supports in place are his aunt and father and one additional support would be church or a support group, or coaching.  Type of Therapy:  Process Group  Participation Level:  Active  Participation Quality:  Attentive and Sharing  Affect:  Blunted and Depressed  Cognitive:  Appropriate and Oriented  Insight:  Engaged  Engagement in Therapy:  Engaged  Modes of Intervention:  Education,  Support and [employer]  Jamara Vary Grossman-Orr, LCSW 03/06/2014, 4:00pm

## 2014-03-07 DIAGNOSIS — F332 Major depressive disorder, recurrent severe without psychotic features: Principal | ICD-10-CM

## 2014-03-07 MED ORDER — BUPROPION HCL ER (SR) 100 MG PO TB12: 100.0000 mg | ORAL_TABLET | Freq: Every day | ORAL | Status: AC

## 2014-03-07 MED ORDER — QUETIAPINE FUMARATE 25 MG PO TABS: 25.0000 mg | ORAL_TABLET | Freq: Two times a day (BID) | ORAL | Status: AC

## 2014-03-07 MED ORDER — SALINE SPRAY 0.65 % NA SOLN: 1.0000 | NASAL | Status: AC | PRN

## 2014-03-07 MED ORDER — TRAZODONE 25 MG HALF TABLET: 75.0000 mg | ORAL_TABLET | Freq: Every day | ORAL | Status: AC

## 2014-03-07 NOTE — [facility] Suicide Risk Assessment (Signed)
   Demographic Factors:  Male, Adolescent or young adult and Caucasian  Total Time spent with patient: 30 minutes  Psychiatric Specialty Exam: Physical Exam  Review of Systems  Psychiatric/Behavioral: Positive for depression.  All other systems reviewed and are negative.    Blood pressure 134/88, pulse 66, temperature 97.4 F (36.3 C), temperature source Oral, resp. rate 20, height 5\' 9"  (1.753 m), weight 70.308 kg (155 lb), SpO2 97.00%.Body mass index is 22.88 kg/(m^2).  General Appearance: Casual  Eye Contact::  Good  Speech:  Clear and Coherent  Volume:  Normal  Mood:  Anxious  Affect:  Appropriate and Congruent  Thought Process:  Goal Directed  Orientation:  Full (Time, Place, and Person)  Thought Content:  WDL  Suicidal Thoughts:  No  Homicidal Thoughts:  No  Memory:  Immediate;   Fair  Judgement:  Good  Insight:  Good  Psychomotor Activity:  Normal  Concentration:  Good  Recall:  Good  Fund of Knowledge:Good  Language: Good  Akathisia:  NA  Handed:  Right  AIMS (if indicated):     Assets:  Communication Skills Desire for Improvement Financial Resources/Insurance Housing Intimacy Physical Health Resilience Social Support Vocational/Educational  Sleep:  Number of Hours: 6    Musculoskeletal: Strength & Muscle Tone: within normal limits Gait & Station: normal Patient leans: N/A   Mental Status Per Nursing Assessment::   On Admission:  Suicide plan;Suicidal ideation indicated by patient  Current Mental Status by Physician: NA  Loss Factors: Financial problems/change in socioeconomic status  Historical Factors: Family history of mental illness or substance abuse  Risk Reduction Factors:   Sense of responsibility to family, Religious beliefs about death, Employed, Living with another person, especially a relative, Positive social support, Positive therapeutic relationship and Positive coping skills or problem solving skills  Continued Clinical  Symptoms:  Depression:   Recent sense of peace/wellbeing Alcohol/Substance Abuse/Dependencies Previous Psychiatric Diagnoses and Treatments  Cognitive Features That Contribute To Risk:  Polarized thinking    Suicide Risk:  Minimal: No identifiable suicidal ideation.  Patients presenting with no risk factors but with morbid ruminations; may be classified as minimal risk based on the severity of the depressive symptoms  Discharge Diagnoses:   AXIS I:  Alcohol Abuse and Major Depression, Recurrent severe AXIS II:  Deferred AXIS III:   Past Medical History  Diagnosis Date  . Depression    AXIS IV:  other psychosocial or environmental problems, problems related to social environment and problems with primary support group AXIS V:  61-70 mild symptoms  Plan Of Care/Follow-up recommendations:  Activity:  as tolerated Diet:  Regular  Is patient on multiple antipsychotic therapies at discharge:  No   Has Patient had three or more failed trials of antipsychotic monotherapy by history:  No  Recommended Plan for Multiple Antipsychotic Therapies: NA    Valeria Krisko,JANARDHAHA R. 03/07/2014, 12:38 PM

## 2014-03-07 NOTE — Progress Notes (Signed)
Pt discharged to home. Bus pass given.  Discharge instructions reviewed and pt stated understanding.

## 2014-03-07 NOTE — [facility] Group Notes (Signed)
[facility]   03/07/2014 8:45 AM  Participation Quality:  Alert, Appropriate and Oriented  Mood/Affect:  Calm  Depression Rating:  1  Anxiety Rating:  1  Thoughts of Suicide:  Pt denies SI/HI  Will you contract for safety?   Yes  Current AVH:  Pt denies  Plan for Discharge/Comments:  Pt attended discharge planning group and actively participated in group.  CSW provided pt with today's workbook.  Pt reports feeling well today and ready to d/c soon.  Pt will return home in [place] with his brother and [facility]  No further needs voiced by pt at this time.    Transportation Means: Pt denies access to transportation now, stating that his brother is out of town for the week.  CSW will assess for how pt can get home  Supports: No supports mentioned at this time  Larry IvanChelsea [name], LCSW 03/07/2014 10:20 AM

## 2014-03-07 NOTE — Tx Team (Addendum)
Interdisciplinary Treatment Plan Update (Adult)  Date: 03/07/2014  Time Reviewed:  9:45 AM  Progress in Treatment: Attending groups: Yes Participating in groups:  Yes Taking medication as prescribed:  Yes Tolerating medication:  Yes Family/Significant othe contact made: Yes, with Larry Salinas's brother Patient understands diagnosis:  Yes Discussing patient identified problems/goals with staff:  Yes Medical problems stabilized or resolved:  Yes Denies suicidal/homicidal ideation: Yes Issues/concerns per patient self-inventory:  Yes Other:  New problem(s) identified: N/A  Discharge Plan or Barriers: Larry Salinas has [facility] medication management and therapy.    Reason for Continuation of Hospitalization: Stable to d/c  Comments: N/A  Estimated length of stay: D/C today  For review of initial/current patient goals, please see plan of care.  Attendees: Patient:     Family:     Physician:  Dr. Javier GlazierJohnalagadda 03/07/2014 10:12 AM   Nursing:   Marzetta Boardhrista Dopson, RN 03/07/2014 10:12 AM   Clinical Social Worker:  Reyes Ivanhelsea [name], LCSW 03/07/2014 10:12 AM   Other: Claudette Headonrad Withrow, PA 03/07/2014 10:12 AM   Other:  Sherrye PayorValerie Noch, care coordination 03/07/2014 10:12 AM   Other:  Juline PatchQuylle Hodnett, LCSW 03/07/2014 10:12 AM   Other:  Chrisandra Nettersana Green, RN 03/07/2014 10:12 AM   Other: Quintella ReichertBeverly Knight, RN 03/07/2014 10:13 AM   Other: Onnie BoerJennifer Clark, RN 03/07/2014 10:13 AM   Other:    Other:    Other:    Other:     Scribe for Treatment Team:   Carmina MillerHorton, Symphonie Schneiderman Nicole, 03/07/2014 10:12 AM

## 2014-03-07 NOTE — Progress Notes (Signed)
Adult Psychoeducational Group Note  Date:  03/07/2014 Time:  4:26 AM  Group Topic/Focus:  Wrap-Up Group:   The focus of this group is to help patients review their daily goal of treatment and discuss progress on daily workbooks.  Participation Level:  Active  Participation Quality:  Appropriate  Affect:  Appropriate  Cognitive:  Appropriate  Insight: Appropriate  Engagement in Group:  Engaged  Modes of Intervention:  Discussion  Additional Comments:  Patient stated that he had a good day and didn't elaborate much on that. Patient says that his support system is his family.  Percell LocusJOHNSON,TAWANA 03/07/2014, 4:26 AM

## 2014-03-07 NOTE — Discharge Summary (Signed)
Physician Discharge Summary Note  Patient:  Larry Salinas is an 53 y.o., male MRN:  [id number] DOB:  1961/06/17 Patient phone:  [phone] (home)  Patient address:   [address] [place] [address],  Total Time spent with patient: Greater than 30 minutes  Date of Admission:  03/03/2014 Date of Discharge: 03/08/14  Reason for Admission:  MDD with SI with plan to shoot himself  Discharge Diagnoses: Active Problems:   MDD (major depressive disorder)   Anxiety   Suicidal thoughts   Alcohol abuse with intoxication   Psychiatric Specialty Exam: Physical Exam  Review of Systems  Constitutional: Negative.   HENT: Negative.   Eyes: Negative.   Respiratory: Negative.   Cardiovascular: Negative.   Gastrointestinal: Negative.   Genitourinary: Negative.   Musculoskeletal: Negative.   Skin: Negative.   Neurological: Negative.   Endo/Heme/Allergies: Negative.   Psychiatric/Behavioral: Positive for depression. Negative for suicidal ideas. The patient is nervous/anxious. The patient does not have insomnia.     Blood pressure 134/88, pulse 66, temperature 97.4 F (36.3 C), temperature source Oral, resp. rate 20, height 5\' 9"  (1.753 m), weight 70.308 kg (155 lb), SpO2 97.00%.Body mass index is 22.88 kg/(m^2).  General Appearance: Casual  Eye Contact::  Good  Speech:  Clear and Coherent  Volume:  Normal  Mood:  Euthymic  Affect:  Appropriate  Thought Process:  Goal Directed  Orientation:  Full (Time, Place, and Person)  Thought Content:  WDL  Suicidal Thoughts:  No  Homicidal Thoughts:  No  Memory:  Immediate;   Good Recent;   Good Remote;   Good  Judgement:  Fair  Insight:  Fair  Psychomotor Activity:  Normal  Concentration:  Good  Recall:  Good  Fund of Knowledge:Good  Language: Good  Akathisia:  NA  Handed:  Right  AIMS (if indicated):     Assets:  Communication Skills Desire for Improvement Resilience Social Support  Sleep:  Number of Hours: 6     Musculoskeletal: Strength & Muscle Tone: within normal limits Gait & Station: normal Patient leans: N/A  DSM5:  Depressive Disorders:  Major Depressive Disorder - Severe (296.23)  Axis Diagnosis:   AXIS I:  Major Depression, Recurrent severe AXIS II:  Deferred AXIS III:   Past Medical History  Diagnosis Date  . Depression    AXIS IV:  other psychosocial or environmental problems and problems related to social environment AXIS V:  61-70 mild symptoms  Level of Care:  OP  Hospital Course:   Patient was admitted voluntarily and emergently with the increased symptoms of depression and suicidal ideation with the plan to shoot himself. Patient was presented with alcohol intoxication at [facility] long emergency department. Patient reported he asked his bed and to bring to the hospital to get the help for depression and substance abuse and suicidal ideation. Patient reported his brother has been disabled with multiple medical problems and mental illness. Patient stated that his depression has gotten worse over the last month. He has multiple psychosocial stressors secondary to not worked since the end of January, financial difficulties her lack of support from family and friends. 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 He has been having thoughts of killing himself. He said that he did not have a gun but could get access to one easily. Patient has been drinking about a 6 pack daily for the last two weeks. He drank less in the time period before then  but said that he would not drink much if he has work. Since he has not had work in a few weeks, he is drinking more. Patient says that he has been smoking marijuana occasionally, less than once monthly. His spasticity positive for substance abuse treatment at [facility] for ETOH rehab over 10 years ago. He did take a trazadone from his brother a couple of nights ago and said he liked being able to sleep  the night through. Urine drug screen is positive for marijuana and his blood alcohol level was 303 on admission to the emergency department.   During Hospitalization: Medications managed, psychoeducation, group and individual therapy. Pt currently denies SI, HI, and Psychosis. At discharge, pt rates anxiety at 1/10 and depression at 1/10. Pt states that he does have a good supportive home environment and will followup with outpatient treatment. 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 Affirms agreement with medication regimen and discharge plan. Denies other physical and psychological concerns at time of discharge.   Consults:  None  Significant Diagnostic Studies:  None  Discharge Vitals:   Blood pressure 134/88, pulse 66, temperature 97.4 F (36.3 C), temperature source Oral, resp. rate 20, height 5\' 9"  (1.753 m), weight 70.308 kg (155 lb), SpO2 97.00%. Body mass index is 22.88 kg/(m^2). Lab Results:   No results found for this or any previous visit (from the past 72 hour(s)).  Physical Findings: AIMS: Facial and Oral Movements Muscles of Facial Expression: None, normal Lips and Perioral Area: None, normal Jaw: None, normal Tongue: None, normal,Extremity Movements Upper (arms, wrists, hands, fingers): None, normal Lower (legs, knees, ankles, toes): None, normal, Trunk Movements Neck, shoulders, hips: None, normal, Overall Severity Severity of abnormal movements (highest score from questions above): None, normal Incapacitation due to abnormal movements: None, normal Patient's awareness of abnormal movements (rate only patient's report): No Awareness, Dental Status Current problems with teeth and/or dentures?: No Does patient usually wear dentures?: No  CIWA:  CIWA-Ar Total: 0 COWS:     Psychiatric Specialty Exam: See Psychiatric Specialty Exam and Suicide Risk Assessment  completed by Attending Physician prior to discharge.  Discharge destination:  Home  Is patient on multiple antipsychotic therapies at discharge:  No   Has Patient had three or more failed trials of antipsychotic monotherapy by history:  No  Recommended Plan for Multiple Antipsychotic Therapies: NA     Medication List    STOP taking these medications       BC HEADACHE POWDER PO      TAKE these medications     Indication   buPROPion 100 MG 12 hr tablet  Commonly known as:  WELLBUTRIN SR  Take 1 tablet (100 mg total) by mouth daily.   Indication:  mood stabilization     QUEtiapine 25 MG tablet  Commonly known as:  SEROQUEL  Take 1 tablet (25 mg total) by mouth 2 (two) times daily.   Indication:  mood stabilization     sodium chloride 0.65 % Soln nasal spray  Commonly known as:  OCEAN  Place 1 spray into both nostrils every 4 (four) hours as needed for congestion.   Indication:  congestion     traZODone 25 mg Tabs tablet  Commonly known as:  DESYREL  Take 1.5 tablets (75 mg total) by mouth daily at 10 pm.   Indication:  Trouble Sleeping  Follow-up Information   Follow up with [facility] On 03/09/2014. (Walk in on this date for hospital discharge appointment. Walk in clinic is Monday - Friday 8 am - 3 pm. They will than schedule you for medication management and therapy)    Contact information:   [address], [place] [address] Phone: [phone] Fax: [phone]      Follow-up recommendations:  Activity:  As tolerated Diet:  Heart healthy with low sodium.  Comments:   Take all medications as prescribed. Keep all follow-up appointments as scheduled.  Do not consume alcohol or use illegal drugs while on prescription medications. Report any adverse effects from your medications to your primary care provider promptly.  In the event of recurrent symptoms or worsening symptoms, call 911, a crisis hotline, or go to the nearest emergency department for  evaluation.   Total Discharge Time:  Greater than 30 minutes.  Signed: Beau Fanny, FNP-BC 03/07/2014, 11:46 AM  Patient was seen for psych evaluation, suicide risk assessment and made disposition plan. Reviewed the information documented and agree with the treatment plan.  Bradie Lacock,JANARDHAHA R. 03/08/2014 5:54 PM

## 2014-03-07 NOTE — Progress Notes (Signed)
[facility] Adult Case Management Discharge Plan :  Will you be returning to the same living situation after discharge: Yes,  returning home At discharge, do you have transportation home?:Yes,  provided pt with bus pass and PART fare Do you have the ability to pay for your medications:Yes,  provided pt with samples and prescriptions as well as [facility] to [facility] for assistance with affording meds  Release of information consent forms completed and in the chart;  Patient's signature needed at discharge.  Patient to Follow up at: Follow-up Information   Follow up with [facility] On 03/09/2014. (Walk in on this date for hospital discharge appointment. Walk in clinic is Monday - Friday 8 am - 3 pm. They will than schedule you for medication management and therapy)    Contact information:   [address] [place], [place] [address] Phone: [phone] Fax: [phone]      Patient denies SI/HI:   Yes,  denies SI/HI    Safety Planning and Suicide Prevention discussed:  Yes,  discussed with pt and pt's brother.  See suicide prevention education note.   Carmina MillerHorton, Talar Fraley Nicole 03/07/2014, 2:37 PM

## 2014-03-07 NOTE — [facility] Group Notes (Signed)
[facility] LCSW Group Therapy  03/07/2014  1:15 PM   Type of Therapy:  Group Therapy  Participation Level:  Active  Participation Quality:  Attentive, Sharing and Supportive  Affect:  Calm  Cognitive:  Alert and Oriented  Insight:  Developing/Improving and Engaged  Engagement in Therapy:  Developing/Improving and Engaged  Modes of Intervention:  Clarification, Confrontation, Discussion, Education, Exploration, Limit-setting, Orientation, Problem-solving, Rapport Building, Art therapist, Socialization and Support  Summary of Progress/Problems: Pt identified obstacles faced currently and processed barriers involved in overcoming these obstacles. Pt identified steps necessary for overcoming these obstacles and explored motivation (internal and external) for facing these difficulties head on. Pt further identified one area of concern in their lives and chose a goal to focus on for today.  Pt shared that his biggest obstacle is access to healthcare.  Pt states that he needs to get insurance so he can get his medical needs met as well.  Pt actively participated and was engaged in group discussion.    Regan Lemming, LCSW 03/07/2014 2:56 PM

## 2014-03-07 NOTE — Progress Notes (Signed)
Patient has been up and active on the unit, attended group this evening and participated. Patient reports having had a good day  -si/hi/a/v hall. Support and encouragement offered, safety maintained on unit, will continue to monitor.

## 2014-03-08 NOTE — Consult Note (Signed)
Face to face evaluation and I agree with this note 

## 2014-03-11 NOTE — Progress Notes (Signed)
Patient Discharge Instructions:  After Visit Summary (AVS):   Faxed to:  03/11/14 Discharge Summary Note:   Faxed to:  03/11/14 Psychiatric Admission Assessment Note:   Faxed to:  03/11/14 Suicide Risk Assessment - Discharge Assessment:   Faxed to:  03/11/14 Faxed/Sent to the Next Level Care provider:  03/11/14 [facility]ax[facility] to [facility] @ [phone]  Jerelene ReddenSheena E Mission, 03/11/2014, 1:24 PM

## 2016-02-28 DEATH — deceased
